# Patient Record
Sex: Male | Born: 1935 | Race: White | Hispanic: No | Smoking: Former smoker
Health system: Southern US, Community
[De-identification: ages and names within clinical notes are randomized; demographics above are authoritative.]

## PROBLEM LIST (undated history)

## (undated) DIAGNOSIS — C801 Malignant (primary) neoplasm, unspecified: Secondary | ICD-10-CM

## (undated) DIAGNOSIS — I4819 Other persistent atrial fibrillation: Secondary | ICD-10-CM

## (undated) DIAGNOSIS — I10 Essential (primary) hypertension: Secondary | ICD-10-CM

## (undated) DIAGNOSIS — R001 Bradycardia, unspecified: Secondary | ICD-10-CM

## (undated) HISTORY — PX: CHOLECYSTECTOMY: SHX55

## (undated) HISTORY — DX: Bradycardia, unspecified: R00.1

## (undated) HISTORY — DX: Other persistent atrial fibrillation: I48.19

## (undated) HISTORY — PX: OTHER SURGICAL HISTORY: SHX169

---

## 1999-02-12 ENCOUNTER — Emergency Department (HOSPITAL_COMMUNITY): Admission: EM | Admit: 1999-02-12 | Discharge: 1999-02-12 | Payer: Self-pay | Admitting: Emergency Medicine

## 1999-10-27 ENCOUNTER — Encounter: Admission: RE | Admit: 1999-10-27 | Discharge: 1999-10-27 | Payer: Self-pay | Admitting: Urology

## 1999-10-27 ENCOUNTER — Encounter: Payer: Self-pay | Admitting: Urology

## 2000-05-10 ENCOUNTER — Encounter: Payer: Self-pay | Admitting: Urology

## 2000-05-10 ENCOUNTER — Encounter: Admission: RE | Admit: 2000-05-10 | Discharge: 2000-05-10 | Payer: Self-pay | Admitting: Urology

## 2001-05-02 ENCOUNTER — Encounter: Admission: RE | Admit: 2001-05-02 | Discharge: 2001-05-02 | Payer: Self-pay | Admitting: Urology

## 2001-05-02 ENCOUNTER — Encounter: Payer: Self-pay | Admitting: Urology

## 2002-10-23 ENCOUNTER — Encounter: Payer: Self-pay | Admitting: Urology

## 2002-10-23 ENCOUNTER — Encounter: Admission: RE | Admit: 2002-10-23 | Discharge: 2002-10-23 | Payer: Self-pay | Admitting: Urology

## 2008-05-13 ENCOUNTER — Ambulatory Visit (HOSPITAL_COMMUNITY): Admission: RE | Admit: 2008-05-13 | Discharge: 2008-05-13 | Payer: Self-pay | Admitting: Urology

## 2008-05-13 ENCOUNTER — Encounter (INDEPENDENT_AMBULATORY_CARE_PROVIDER_SITE_OTHER): Payer: Self-pay | Admitting: Interventional Radiology

## 2008-05-22 ENCOUNTER — Ambulatory Visit: Payer: Self-pay | Admitting: Oncology

## 2008-06-03 LAB — CBC WITH DIFFERENTIAL/PLATELET
BASO%: 0.4 % (ref 0.0–2.0)
EOS%: 4.6 % (ref 0.0–7.0)
HCT: 40.7 % (ref 38.7–49.9)
LYMPH%: 27.3 % (ref 14.0–48.0)
MCH: 32.7 pg (ref 28.0–33.4)
MCHC: 34.8 g/dL (ref 32.0–35.9)
MCV: 94 fL (ref 81.6–98.0)
MONO%: 11.5 % (ref 0.0–13.0)
NEUT%: 56.2 % (ref 40.0–75.0)
Platelets: 205 10*3/uL (ref 145–400)

## 2008-06-03 LAB — COMPREHENSIVE METABOLIC PANEL
ALT: 24 U/L (ref 0–53)
AST: 21 U/L (ref 0–37)
Alkaline Phosphatase: 51 U/L (ref 39–117)
CO2: 29 mEq/L (ref 19–32)
Creatinine, Ser: 1.52 mg/dL — ABNORMAL HIGH (ref 0.40–1.50)
Total Bilirubin: 0.4 mg/dL (ref 0.3–1.2)

## 2008-06-03 LAB — PSA: PSA: 1.62 ng/mL (ref 0.10–4.00)

## 2008-07-02 ENCOUNTER — Ambulatory Visit: Payer: Self-pay | Admitting: Oncology

## 2008-07-07 ENCOUNTER — Ambulatory Visit (HOSPITAL_COMMUNITY): Admission: RE | Admit: 2008-07-07 | Discharge: 2008-07-07 | Payer: Self-pay | Admitting: Oncology

## 2008-07-09 LAB — CBC WITH DIFFERENTIAL/PLATELET
Basophils Absolute: 0 10*3/uL (ref 0.0–0.1)
Eosinophils Absolute: 0.3 10*3/uL (ref 0.0–0.5)
HCT: 40.3 % (ref 38.7–49.9)
LYMPH%: 28 % (ref 14.0–48.0)
MONO#: 0.7 10*3/uL (ref 0.1–0.9)
NEUT#: 2.4 10*3/uL (ref 1.5–6.5)
NEUT%: 51.3 % (ref 40.0–75.0)
Platelets: 159 10*3/uL (ref 145–400)
WBC: 4.8 10*3/uL (ref 4.0–10.0)

## 2008-07-09 LAB — COMPREHENSIVE METABOLIC PANEL
CO2: 26 mEq/L (ref 19–32)
Creatinine, Ser: 1.4 mg/dL (ref 0.40–1.50)
Glucose, Bld: 125 mg/dL — ABNORMAL HIGH (ref 70–99)
Total Bilirubin: 0.4 mg/dL (ref 0.3–1.2)
Total Protein: 6.3 g/dL (ref 6.0–8.3)

## 2008-10-09 ENCOUNTER — Ambulatory Visit: Payer: Self-pay | Admitting: Oncology

## 2008-10-13 LAB — CBC WITH DIFFERENTIAL/PLATELET
Basophils Absolute: 0 10*3/uL (ref 0.0–0.1)
EOS%: 6.9 % (ref 0.0–7.0)
Eosinophils Absolute: 0.3 10*3/uL (ref 0.0–0.5)
LYMPH%: 27.2 % (ref 14.0–48.0)
MCH: 33 pg (ref 28.0–33.4)
MCV: 94.4 fL (ref 81.6–98.0)
MONO%: 13.1 % — ABNORMAL HIGH (ref 0.0–13.0)
NEUT#: 2.3 10*3/uL (ref 1.5–6.5)
Platelets: 157 10*3/uL (ref 145–400)
RBC: 4.49 10*6/uL (ref 4.20–5.71)

## 2008-10-13 LAB — COMPREHENSIVE METABOLIC PANEL
CO2: 30 mEq/L (ref 19–32)
Creatinine, Ser: 1.29 mg/dL (ref 0.40–1.50)
Glucose, Bld: 158 mg/dL — ABNORMAL HIGH (ref 70–99)
Total Bilirubin: 0.5 mg/dL (ref 0.3–1.2)

## 2008-10-13 LAB — LACTATE DEHYDROGENASE: LDH: 160 U/L (ref 94–250)

## 2009-01-08 ENCOUNTER — Ambulatory Visit: Payer: Self-pay | Admitting: Oncology

## 2009-01-12 ENCOUNTER — Ambulatory Visit (HOSPITAL_COMMUNITY): Admission: RE | Admit: 2009-01-12 | Discharge: 2009-01-12 | Payer: Self-pay | Admitting: Oncology

## 2009-04-16 ENCOUNTER — Ambulatory Visit: Payer: Self-pay | Admitting: Oncology

## 2009-07-16 ENCOUNTER — Ambulatory Visit: Payer: Self-pay | Admitting: Oncology

## 2009-07-20 ENCOUNTER — Ambulatory Visit (HOSPITAL_COMMUNITY): Admission: RE | Admit: 2009-07-20 | Discharge: 2009-07-20 | Payer: Self-pay | Admitting: Oncology

## 2009-07-20 LAB — CBC WITH DIFFERENTIAL/PLATELET
Basophils Absolute: 0 10*3/uL (ref 0.0–0.1)
EOS%: 2.8 % (ref 0.0–7.0)
HCT: 43.5 % (ref 38.4–49.9)
HGB: 14.9 g/dL (ref 13.0–17.1)
MCH: 32.6 pg (ref 27.2–33.4)
MCV: 95.1 fL (ref 79.3–98.0)
NEUT%: 54.6 % (ref 39.0–75.0)
lymph#: 1.4 10*3/uL (ref 0.9–3.3)

## 2009-07-20 LAB — COMPREHENSIVE METABOLIC PANEL
AST: 24 U/L (ref 0–37)
BUN: 20 mg/dL (ref 6–23)
Calcium: 9.8 mg/dL (ref 8.4–10.5)
Chloride: 98 mEq/L (ref 96–112)
Creatinine, Ser: 1.62 mg/dL — ABNORMAL HIGH (ref 0.40–1.50)

## 2009-07-20 LAB — LACTATE DEHYDROGENASE: LDH: 173 U/L (ref 94–250)

## 2009-08-29 ENCOUNTER — Encounter (HOSPITAL_COMMUNITY): Payer: Self-pay | Admitting: Emergency Medicine

## 2009-08-30 ENCOUNTER — Inpatient Hospital Stay (HOSPITAL_COMMUNITY): Admission: EM | Admit: 2009-08-30 | Discharge: 2009-09-03 | Payer: Self-pay | Admitting: Internal Medicine

## 2009-09-02 ENCOUNTER — Encounter (INDEPENDENT_AMBULATORY_CARE_PROVIDER_SITE_OTHER): Payer: Self-pay | Admitting: Internal Medicine

## 2009-09-24 ENCOUNTER — Encounter: Admission: RE | Admit: 2009-09-24 | Discharge: 2009-09-24 | Payer: Self-pay | Admitting: Neurosurgery

## 2009-10-25 ENCOUNTER — Ambulatory Visit: Payer: Self-pay | Admitting: Oncology

## 2009-10-27 LAB — CBC WITH DIFFERENTIAL/PLATELET
BASO%: 0.4 % (ref 0.0–2.0)
Eosinophils Absolute: 0.2 10*3/uL (ref 0.0–0.5)
MCHC: 33.4 g/dL (ref 32.0–36.0)
MONO#: 0.6 10*3/uL (ref 0.1–0.9)
MONO%: 13 % (ref 0.0–14.0)
NEUT#: 2.7 10*3/uL (ref 1.5–6.5)
RBC: 4.01 10*6/uL — ABNORMAL LOW (ref 4.20–5.82)
RDW: 14.8 % — ABNORMAL HIGH (ref 11.0–14.6)
WBC: 4.9 10*3/uL (ref 4.0–10.3)

## 2009-10-27 LAB — COMPREHENSIVE METABOLIC PANEL
ALT: 23 U/L (ref 0–53)
AST: 20 U/L (ref 0–37)
Alkaline Phosphatase: 88 U/L (ref 39–117)
Creatinine, Ser: 1.48 mg/dL (ref 0.40–1.50)
Total Bilirubin: 0.3 mg/dL (ref 0.3–1.2)

## 2009-10-27 LAB — LACTATE DEHYDROGENASE: LDH: 158 U/L (ref 94–250)

## 2010-01-21 ENCOUNTER — Ambulatory Visit: Payer: Self-pay | Admitting: Oncology

## 2010-01-25 ENCOUNTER — Ambulatory Visit (HOSPITAL_COMMUNITY): Admission: RE | Admit: 2010-01-25 | Discharge: 2010-01-25 | Payer: Self-pay | Admitting: Oncology

## 2010-01-25 LAB — CBC WITH DIFFERENTIAL/PLATELET
BASO%: 0.3 % (ref 0.0–2.0)
Eosinophils Absolute: 0.2 10*3/uL (ref 0.0–0.5)
LYMPH%: 22 % (ref 14.0–49.0)
MCHC: 34 g/dL (ref 32.0–36.0)
MONO#: 0.7 10*3/uL (ref 0.1–0.9)
MONO%: 12.4 % (ref 0.0–14.0)
RDW: 14.1 % (ref 11.0–14.6)
WBC: 5.3 10*3/uL (ref 4.0–10.3)

## 2010-01-25 LAB — COMPREHENSIVE METABOLIC PANEL
ALT: 28 U/L (ref 0–53)
AST: 23 U/L (ref 0–37)
Albumin: 3.8 g/dL (ref 3.5–5.2)
Alkaline Phosphatase: 71 U/L (ref 39–117)
BUN: 18 mg/dL (ref 6–23)
Total Bilirubin: 0.5 mg/dL (ref 0.3–1.2)

## 2010-04-25 ENCOUNTER — Ambulatory Visit: Payer: Self-pay | Admitting: Oncology

## 2010-06-13 ENCOUNTER — Ambulatory Visit: Payer: Self-pay | Admitting: Oncology

## 2010-06-15 LAB — CBC WITH DIFFERENTIAL/PLATELET
BASO%: 0.5 % (ref 0.0–2.0)
Basophils Absolute: 0 10*3/uL (ref 0.0–0.1)
EOS%: 3.7 % (ref 0.0–7.0)
Eosinophils Absolute: 0.1 10*3/uL (ref 0.0–0.5)
HCT: 44.2 % (ref 38.4–49.9)
LYMPH%: 26.4 % (ref 14.0–49.0)
MCH: 33.5 pg — ABNORMAL HIGH (ref 27.2–33.4)
MCHC: 34.4 g/dL (ref 32.0–36.0)
MONO#: 0.4 10*3/uL (ref 0.1–0.9)
MONO%: 10.7 % (ref 0.0–14.0)
NEUT%: 58.7 % (ref 39.0–75.0)
RDW: 13.5 % (ref 11.0–14.6)

## 2010-06-15 LAB — COMPREHENSIVE METABOLIC PANEL
ALT: 25 U/L (ref 0–53)
AST: 25 U/L (ref 0–37)
CO2: 22 mEq/L (ref 19–32)
Calcium: 9.8 mg/dL (ref 8.4–10.5)
Chloride: 103 mEq/L (ref 96–112)
Sodium: 136 mEq/L (ref 135–145)
Total Bilirubin: 0.5 mg/dL (ref 0.3–1.2)

## 2010-06-15 LAB — LACTATE DEHYDROGENASE: LDH: 189 U/L (ref 94–250)

## 2010-06-29 ENCOUNTER — Ambulatory Visit (HOSPITAL_COMMUNITY): Admission: RE | Admit: 2010-06-29 | Discharge: 2010-06-29 | Payer: Self-pay | Admitting: Orthopaedic Surgery

## 2010-06-29 ENCOUNTER — Encounter (INDEPENDENT_AMBULATORY_CARE_PROVIDER_SITE_OTHER): Payer: Self-pay | Admitting: Orthopaedic Surgery

## 2010-06-29 ENCOUNTER — Ambulatory Visit: Payer: Self-pay | Admitting: Vascular Surgery

## 2010-07-18 ENCOUNTER — Encounter: Admission: RE | Admit: 2010-07-18 | Discharge: 2010-07-18 | Payer: Self-pay | Admitting: Orthopaedic Surgery

## 2010-08-03 ENCOUNTER — Emergency Department (HOSPITAL_COMMUNITY): Admission: EM | Admit: 2010-08-03 | Discharge: 2010-08-03 | Payer: Self-pay | Admitting: Family Medicine

## 2010-09-09 ENCOUNTER — Ambulatory Visit: Payer: Self-pay | Admitting: Oncology

## 2010-09-13 ENCOUNTER — Ambulatory Visit (HOSPITAL_COMMUNITY): Admission: RE | Admit: 2010-09-13 | Discharge: 2010-09-13 | Payer: Self-pay | Admitting: Oncology

## 2010-09-13 LAB — COMPREHENSIVE METABOLIC PANEL
ALT: 29 U/L (ref 0–53)
AST: 21 U/L (ref 0–37)
BUN: 18 mg/dL (ref 6–23)
Chloride: 102 mEq/L (ref 96–112)
Potassium: 4.4 mEq/L (ref 3.5–5.3)

## 2010-09-13 LAB — CBC WITH DIFFERENTIAL/PLATELET
HCT: 46 % (ref 38.4–49.9)
MCHC: 32.4 g/dL (ref 32.0–36.0)
MCV: 100.5 fL — ABNORMAL HIGH (ref 79.3–98.0)
MONO#: 0.5 10*3/uL (ref 0.1–0.9)
MONO%: 11.4 % (ref 0.0–14.0)
NEUT%: 58 % (ref 39.0–75.0)
RDW: 14 % (ref 11.0–14.6)
WBC: 4.4 10*3/uL (ref 4.0–10.3)

## 2010-12-13 ENCOUNTER — Ambulatory Visit: Payer: Self-pay | Admitting: Oncology

## 2010-12-15 LAB — COMPREHENSIVE METABOLIC PANEL
AST: 15 U/L (ref 0–37)
Albumin: 3.8 g/dL (ref 3.5–5.2)
Alkaline Phosphatase: 79 U/L (ref 39–117)
BUN: 22 mg/dL (ref 6–23)
CO2: 30 mEq/L (ref 19–32)
Calcium: 9.7 mg/dL (ref 8.4–10.5)

## 2010-12-15 LAB — CBC WITH DIFFERENTIAL/PLATELET
EOS%: 9.9 % — ABNORMAL HIGH (ref 0.0–7.0)
Eosinophils Absolute: 0.5 10*3/uL (ref 0.0–0.5)
HGB: 15 g/dL (ref 13.0–17.1)
MCH: 34.1 pg — ABNORMAL HIGH (ref 27.2–33.4)
MCV: 99.5 fL — ABNORMAL HIGH (ref 79.3–98.0)
MONO%: 11 % (ref 0.0–14.0)
NEUT%: 50.8 % (ref 39.0–75.0)
RBC: 4.39 10*6/uL (ref 4.20–5.82)
RDW: 14.3 % (ref 11.0–14.6)

## 2010-12-15 LAB — LACTATE DEHYDROGENASE: LDH: 132 U/L (ref 94–250)

## 2011-01-06 ENCOUNTER — Other Ambulatory Visit: Payer: Self-pay | Admitting: Oncology

## 2011-01-06 DIAGNOSIS — Z85528 Personal history of other malignant neoplasm of kidney: Secondary | ICD-10-CM

## 2011-01-08 ENCOUNTER — Encounter: Payer: Self-pay | Admitting: Oncology

## 2011-01-30 ENCOUNTER — Ambulatory Visit: Payer: Medicare Other | Attending: Family Medicine | Admitting: Physical Therapy

## 2011-01-30 DIAGNOSIS — M545 Low back pain, unspecified: Secondary | ICD-10-CM | POA: Insufficient documentation

## 2011-01-30 DIAGNOSIS — M6281 Muscle weakness (generalized): Secondary | ICD-10-CM | POA: Insufficient documentation

## 2011-01-30 DIAGNOSIS — R293 Abnormal posture: Secondary | ICD-10-CM | POA: Insufficient documentation

## 2011-01-30 DIAGNOSIS — R262 Difficulty in walking, not elsewhere classified: Secondary | ICD-10-CM | POA: Insufficient documentation

## 2011-01-30 DIAGNOSIS — IMO0001 Reserved for inherently not codable concepts without codable children: Secondary | ICD-10-CM | POA: Insufficient documentation

## 2011-02-06 ENCOUNTER — Ambulatory Visit: Payer: Medicare Other | Admitting: Physical Therapy

## 2011-02-09 ENCOUNTER — Ambulatory Visit: Payer: Medicare Other | Admitting: Physical Therapy

## 2011-02-13 ENCOUNTER — Ambulatory Visit: Payer: Medicare Other | Admitting: Rehabilitation

## 2011-02-16 ENCOUNTER — Ambulatory Visit: Payer: Medicare Other | Attending: Physical Medicine and Rehabilitation | Admitting: Rehabilitation

## 2011-02-16 DIAGNOSIS — R293 Abnormal posture: Secondary | ICD-10-CM | POA: Insufficient documentation

## 2011-02-16 DIAGNOSIS — IMO0001 Reserved for inherently not codable concepts without codable children: Secondary | ICD-10-CM | POA: Insufficient documentation

## 2011-02-16 DIAGNOSIS — M545 Low back pain, unspecified: Secondary | ICD-10-CM | POA: Insufficient documentation

## 2011-02-16 DIAGNOSIS — R262 Difficulty in walking, not elsewhere classified: Secondary | ICD-10-CM | POA: Insufficient documentation

## 2011-02-16 DIAGNOSIS — M6281 Muscle weakness (generalized): Secondary | ICD-10-CM | POA: Insufficient documentation

## 2011-02-20 ENCOUNTER — Ambulatory Visit: Payer: Medicare Other

## 2011-02-23 ENCOUNTER — Ambulatory Visit: Payer: Medicare Other | Admitting: Rehabilitation

## 2011-03-14 ENCOUNTER — Ambulatory Visit (HOSPITAL_COMMUNITY)
Admission: RE | Admit: 2011-03-14 | Discharge: 2011-03-14 | Disposition: A | Discharge status: Home / Self Care | Payer: Medicare Other | Source: Ambulatory Visit | Attending: Oncology | Admitting: Oncology

## 2011-03-14 ENCOUNTER — Encounter (HOSPITAL_BASED_OUTPATIENT_CLINIC_OR_DEPARTMENT_OTHER): Payer: Medicare Other | Admitting: Oncology

## 2011-03-14 ENCOUNTER — Encounter (HOSPITAL_COMMUNITY): Payer: Self-pay

## 2011-03-14 ENCOUNTER — Other Ambulatory Visit: Payer: Self-pay | Admitting: Oncology

## 2011-03-14 DIAGNOSIS — C772 Secondary and unspecified malignant neoplasm of intra-abdominal lymph nodes: Secondary | ICD-10-CM | POA: Insufficient documentation

## 2011-03-14 DIAGNOSIS — C61 Malignant neoplasm of prostate: Secondary | ICD-10-CM

## 2011-03-14 DIAGNOSIS — N400 Enlarged prostate without lower urinary tract symptoms: Secondary | ICD-10-CM | POA: Insufficient documentation

## 2011-03-14 DIAGNOSIS — R911 Solitary pulmonary nodule: Secondary | ICD-10-CM | POA: Insufficient documentation

## 2011-03-14 DIAGNOSIS — Z85528 Personal history of other malignant neoplasm of kidney: Secondary | ICD-10-CM

## 2011-03-14 DIAGNOSIS — E041 Nontoxic single thyroid nodule: Secondary | ICD-10-CM | POA: Insufficient documentation

## 2011-03-14 DIAGNOSIS — I517 Cardiomegaly: Secondary | ICD-10-CM | POA: Insufficient documentation

## 2011-03-14 DIAGNOSIS — I2584 Coronary atherosclerosis due to calcified coronary lesion: Secondary | ICD-10-CM | POA: Insufficient documentation

## 2011-03-14 DIAGNOSIS — K5730 Diverticulosis of large intestine without perforation or abscess without bleeding: Secondary | ICD-10-CM | POA: Insufficient documentation

## 2011-03-14 DIAGNOSIS — J438 Other emphysema: Secondary | ICD-10-CM | POA: Insufficient documentation

## 2011-03-14 HISTORY — DX: Essential (primary) hypertension: I10

## 2011-03-14 HISTORY — DX: Malignant (primary) neoplasm, unspecified: C80.1

## 2011-03-14 LAB — COMPREHENSIVE METABOLIC PANEL
ALT: 15 U/L (ref 0–53)
AST: 16 U/L (ref 0–37)
CO2: 29 mEq/L (ref 19–32)
Calcium: 9.3 mg/dL (ref 8.4–10.5)
Chloride: 102 mEq/L (ref 96–112)
Creatinine, Ser: 1.19 mg/dL (ref 0.40–1.50)
Potassium: 4.5 mEq/L (ref 3.5–5.3)
Sodium: 139 mEq/L (ref 135–145)
Total Protein: 6.6 g/dL (ref 6.0–8.3)

## 2011-03-14 LAB — CBC WITH DIFFERENTIAL/PLATELET
BASO%: 0.7 % (ref 0.0–2.0)
EOS%: 7.1 % — ABNORMAL HIGH (ref 0.0–7.0)
MONO#: 0.5 10*3/uL (ref 0.1–0.9)
MONO%: 12.5 % (ref 0.0–14.0)
NEUT%: 49.2 % (ref 39.0–75.0)
RBC: 4.33 10*6/uL (ref 4.20–5.82)

## 2011-03-16 ENCOUNTER — Encounter (HOSPITAL_BASED_OUTPATIENT_CLINIC_OR_DEPARTMENT_OTHER): Payer: Medicare Other | Admitting: Oncology

## 2011-03-16 DIAGNOSIS — N189 Chronic kidney disease, unspecified: Secondary | ICD-10-CM

## 2011-03-16 DIAGNOSIS — C649 Malignant neoplasm of unspecified kidney, except renal pelvis: Secondary | ICD-10-CM

## 2011-03-23 ENCOUNTER — Ambulatory Visit: Payer: Medicare Other | Attending: Physical Medicine and Rehabilitation | Admitting: Rehabilitation

## 2011-03-23 DIAGNOSIS — R262 Difficulty in walking, not elsewhere classified: Secondary | ICD-10-CM | POA: Insufficient documentation

## 2011-03-23 DIAGNOSIS — R293 Abnormal posture: Secondary | ICD-10-CM | POA: Insufficient documentation

## 2011-03-23 DIAGNOSIS — M545 Low back pain, unspecified: Secondary | ICD-10-CM | POA: Insufficient documentation

## 2011-03-23 DIAGNOSIS — M6281 Muscle weakness (generalized): Secondary | ICD-10-CM | POA: Insufficient documentation

## 2011-03-23 DIAGNOSIS — IMO0001 Reserved for inherently not codable concepts without codable children: Secondary | ICD-10-CM | POA: Insufficient documentation

## 2011-03-24 LAB — HEPATIC FUNCTION PANEL
ALT: 30 U/L (ref 0–53)
AST: 26 U/L (ref 0–37)
Albumin: 3.6 g/dL (ref 3.5–5.2)
Bilirubin, Direct: 0.2 mg/dL (ref 0.0–0.3)
Total Bilirubin: 0.8 mg/dL (ref 0.3–1.2)

## 2011-03-24 LAB — CARDIAC PANEL(CRET KIN+CKTOT+MB+TROPI)
CK, MB: 2.1 ng/mL (ref 0.3–4.0)
CK, MB: 3.5 ng/mL (ref 0.3–4.0)
CK, MB: 5.2 ng/mL — ABNORMAL HIGH (ref 0.3–4.0)
Relative Index: 2.8 — ABNORMAL HIGH (ref 0.0–2.5)
Relative Index: 4.1 — ABNORMAL HIGH (ref 0.0–2.5)
Relative Index: INVALID (ref 0.0–2.5)
Total CK: 124 U/L (ref 7–232)
Total CK: 128 U/L (ref 7–232)
Total CK: 92 U/L (ref 7–232)
Troponin I: 0.24 ng/mL — ABNORMAL HIGH (ref 0.00–0.06)
Troponin I: 0.25 ng/mL — ABNORMAL HIGH (ref 0.00–0.06)
Troponin I: 0.27 ng/mL — ABNORMAL HIGH (ref 0.00–0.06)

## 2011-03-24 LAB — BASIC METABOLIC PANEL
BUN: 21 mg/dL (ref 6–23)
CO2: 25 mEq/L (ref 19–32)
Calcium: 8.8 mg/dL (ref 8.4–10.5)
Calcium: 8.9 mg/dL (ref 8.4–10.5)
Calcium: 9.1 mg/dL (ref 8.4–10.5)
Chloride: 99 mEq/L (ref 96–112)
Chloride: 103 mEq/L (ref 96–112)
Creatinine, Ser: 1.43 mg/dL (ref 0.4–1.5)
Creatinine, Ser: 1.36 mg/dL (ref 0.4–1.5)
GFR calc Af Amer: >60 mL/min (ref >60)
Glucose, Bld: 129 mg/dL — ABNORMAL HIGH (ref 70–99)
Sodium: 135 mEq/L (ref 135–145)
Sodium: 136 mEq/L (ref 135–145)

## 2011-03-24 LAB — GLUCOSE, CAPILLARY
Glucose-Capillary: 190 mg/dL — ABNORMAL HIGH (ref 70–99)
Glucose-Capillary: 207 mg/dL — ABNORMAL HIGH (ref 70–99)
Glucose-Capillary: 244 mg/dL — ABNORMAL HIGH (ref 70–99)
Glucose-Capillary: 112 mg/dL — ABNORMAL HIGH (ref 70–99)
Glucose-Capillary: 113 mg/dL — ABNORMAL HIGH (ref 70–99)
Glucose-Capillary: 119 mg/dL — ABNORMAL HIGH (ref 70–99)
Glucose-Capillary: 125 mg/dL — ABNORMAL HIGH (ref 70–99)
Glucose-Capillary: 131 mg/dL — ABNORMAL HIGH (ref 70–99)
Glucose-Capillary: 136 mg/dL — ABNORMAL HIGH (ref 70–99)
Glucose-Capillary: 140 mg/dL — ABNORMAL HIGH (ref 70–99)
Glucose-Capillary: 162 mg/dL — ABNORMAL HIGH (ref 70–99)
Glucose-Capillary: 168 mg/dL — ABNORMAL HIGH (ref 70–99)
Glucose-Capillary: 170 mg/dL — ABNORMAL HIGH (ref 70–99)
Glucose-Capillary: 237 mg/dL — ABNORMAL HIGH (ref 70–99)
Glucose-Capillary: 251 mg/dL — ABNORMAL HIGH (ref 70–99)

## 2011-03-24 LAB — TYPE AND SCREEN
ABO/RH(D): A NEG
Antibody Screen: NEGATIVE
Antibody Screen: NEGATIVE

## 2011-03-24 LAB — LIPID PANEL
Cholesterol: 194 mg/dL (ref 0–200)
HDL: 41 mg/dL (ref >39)
Triglycerides: 128 mg/dL (ref <150)

## 2011-03-24 LAB — PROTIME-INR
INR: 1.3 (ref 0.00–1.49)
Prothrombin Time: 14.3 seconds (ref 11.6–15.2)

## 2011-03-24 LAB — PREPARE FRESH FROZEN PLASMA

## 2011-03-24 LAB — CBC
Hemoglobin: 15.3 g/dL (ref 13.0–17.0)
MCHC: 34.3 g/dL (ref 30.0–36.0)
MCV: 96.1 fL (ref 78.0–100.0)
RBC: 4.24 MIL/uL (ref 4.22–5.81)
RDW: 13.9 % (ref 11.5–15.5)
WBC: 5.1 10*3/uL (ref 4.0–10.5)

## 2011-03-24 LAB — DIFFERENTIAL
Lymphs Abs: 1 10*3/uL (ref 0.7–4.0)
Monocytes Relative: 9 % (ref 3–12)
Neutro Abs: 3.5 10*3/uL (ref 1.7–7.7)
Neutrophils Relative %: 69 % (ref 43–77)

## 2011-03-24 LAB — APTT: aPTT: 25 seconds (ref 24–37)

## 2011-03-24 LAB — PHOSPHORUS: Phosphorus: 2.9 mg/dL (ref 2.3–4.6)

## 2011-03-24 LAB — HEMOGLOBIN A1C: Mean Plasma Glucose: 194 mg/dL

## 2011-03-24 LAB — TSH: TSH: 1.919 u[IU]/mL (ref 0.350–4.500)

## 2011-03-24 LAB — TROPONIN I: Troponin I: 0.24 ng/mL — ABNORMAL HIGH (ref 0.00–0.06)

## 2011-03-24 LAB — ABO/RH
ABO/RH(D): A NEG
ABO/RH(D): A NEG

## 2011-03-28 ENCOUNTER — Ambulatory Visit: Payer: Medicare Other | Admitting: Rehabilitation

## 2011-03-30 ENCOUNTER — Ambulatory Visit: Payer: Medicare Other | Admitting: Physical Therapy

## 2011-03-31 ENCOUNTER — Encounter: Payer: Medicare Other | Admitting: Physical Therapy

## 2011-04-04 ENCOUNTER — Ambulatory Visit: Payer: Medicare Other | Admitting: Physical Therapy

## 2011-04-06 ENCOUNTER — Ambulatory Visit: Payer: Medicare Other | Admitting: Rehabilitation

## 2011-04-10 ENCOUNTER — Ambulatory Visit: Payer: Medicare Other | Admitting: Physical Therapy

## 2011-04-14 ENCOUNTER — Ambulatory Visit: Payer: Medicare Other | Admitting: Rehabilitation

## 2011-04-19 ENCOUNTER — Ambulatory Visit: Payer: Medicare Other | Attending: Physical Medicine and Rehabilitation | Admitting: Rehabilitation

## 2011-04-19 DIAGNOSIS — M545 Low back pain, unspecified: Secondary | ICD-10-CM | POA: Insufficient documentation

## 2011-04-19 DIAGNOSIS — IMO0001 Reserved for inherently not codable concepts without codable children: Secondary | ICD-10-CM | POA: Insufficient documentation

## 2011-04-19 DIAGNOSIS — M6281 Muscle weakness (generalized): Secondary | ICD-10-CM | POA: Insufficient documentation

## 2011-04-19 DIAGNOSIS — R293 Abnormal posture: Secondary | ICD-10-CM | POA: Insufficient documentation

## 2011-04-19 DIAGNOSIS — R262 Difficulty in walking, not elsewhere classified: Secondary | ICD-10-CM | POA: Insufficient documentation

## 2011-06-02 ENCOUNTER — Telehealth: Payer: Self-pay | Admitting: Cardiology

## 2011-06-02 NOTE — Telephone Encounter (Signed)
Please call to assign his new cardiologists. He hasn't been in since 09/13/10 and I didn't see a recall in the old system. You may possibly have th file.

## 2011-06-05 NOTE — Telephone Encounter (Signed)
Left message for pt to call back to set up appointment with pt's new cardiologist.

## 2011-07-06 IMAGING — CT CT HEAD W/O CM
2 series · 16 of 30 positions shown, 20 images · non-contrast
Comparison: CT 09/02/2009 and MRI 08/31/2009.

CLINICAL DATA: Follow-up subdural hematoma.

CT HEAD WITHOUT CONTRAST
TECHNIQUE: Contiguous axial images were obtained from the base of
the skull through the vertex without contrast.

[Series 3: head bone · axial · 0.49mm/px · z∈[+13,+59]mm · 3 of 32 slices shown]
[im 3/32  bone]
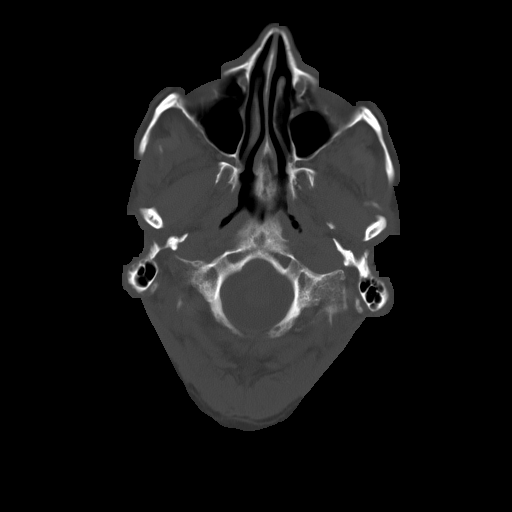
[im 7/32  bone]
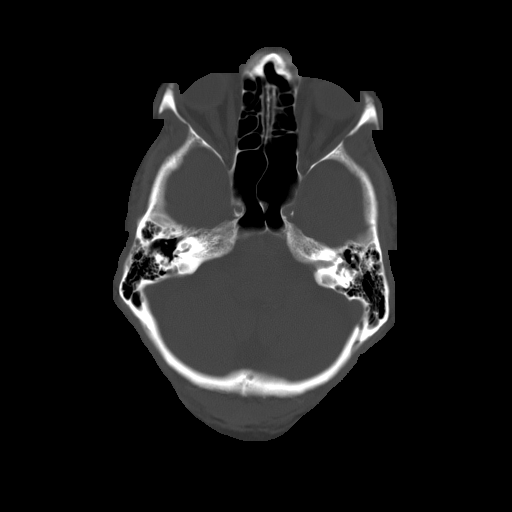
[im 12/32  bone]
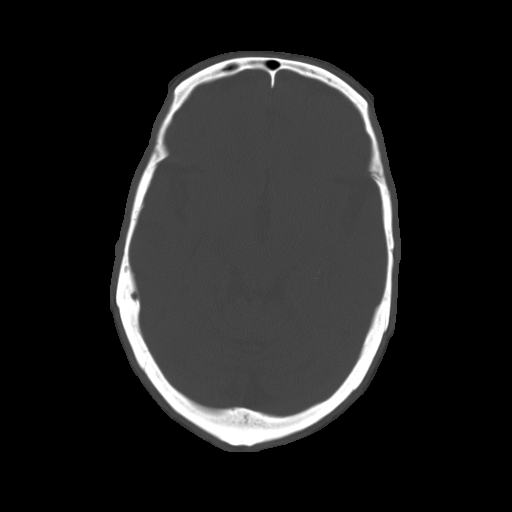

[Series 32: 3d filtered head w/o · axial · non-contrast · 0.49mm/px · z∈[+13,+148]mm · 13 of 32 slices shown, 17 images]
[im 3/32  brain]
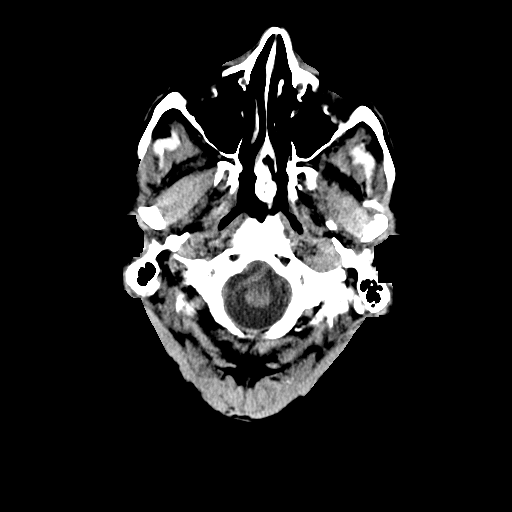
[im 3/32  bone]
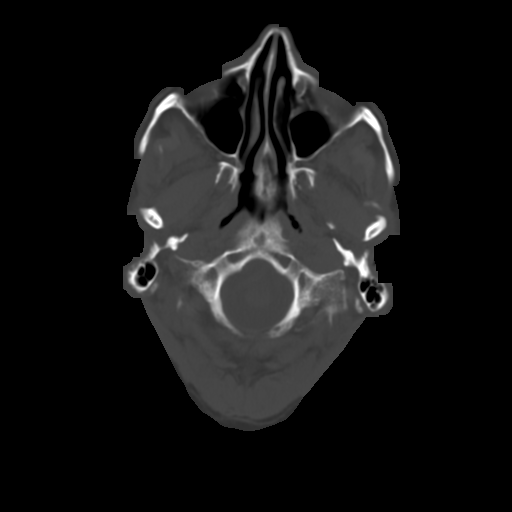
[im 5/32  brain]
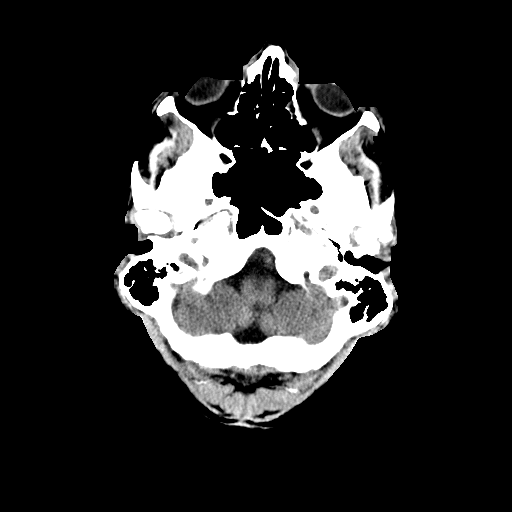
[im 7/32  brain]
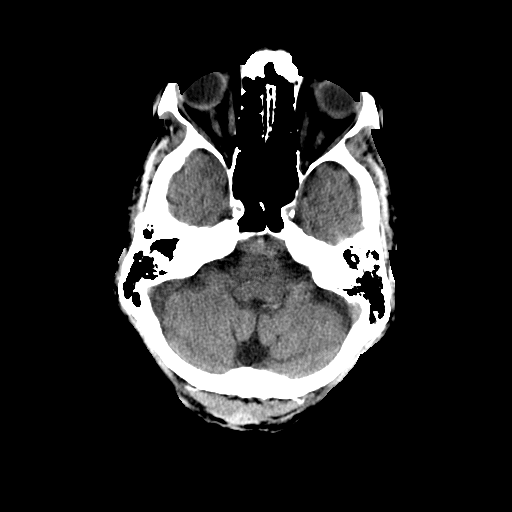
[im 9/32  brain]
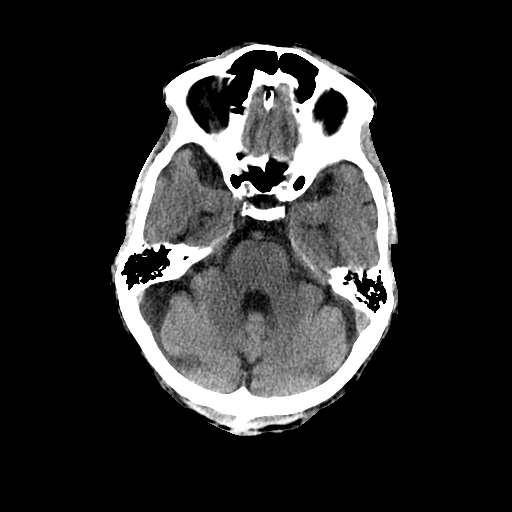
[im 12/32  brain]
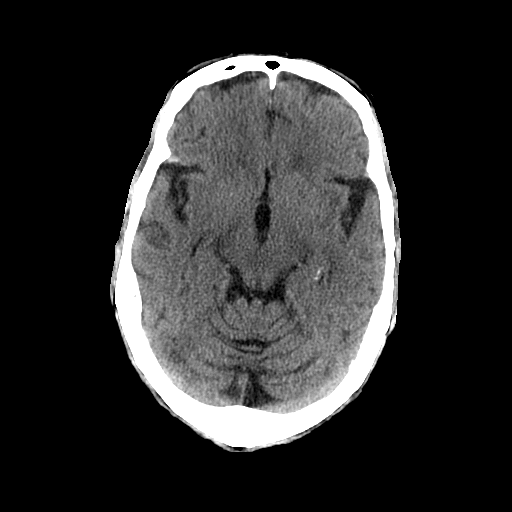
[im 12/32  bone]
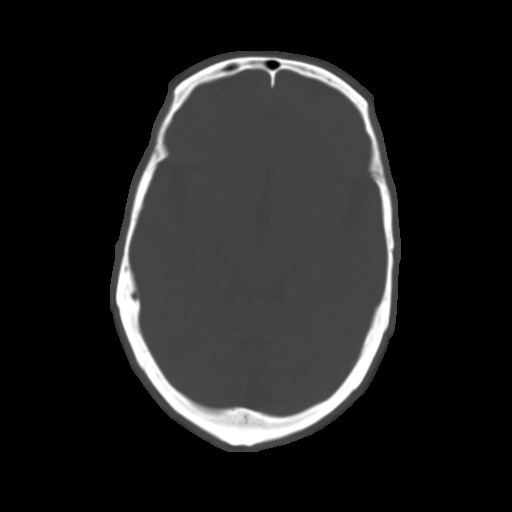
[im 14/32  brain]
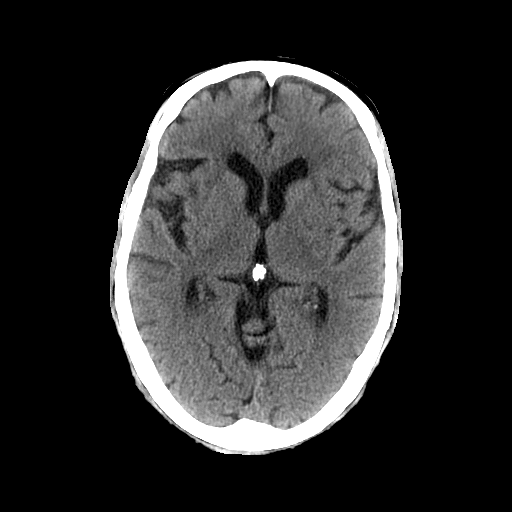
[im 16/32  brain]
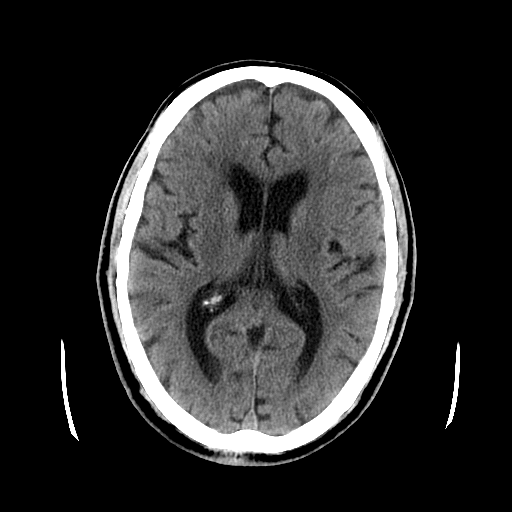
[im 18/32  brain]
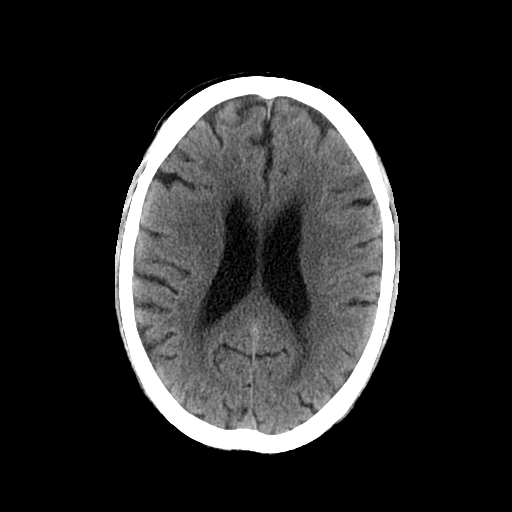
[im 20/32  brain]
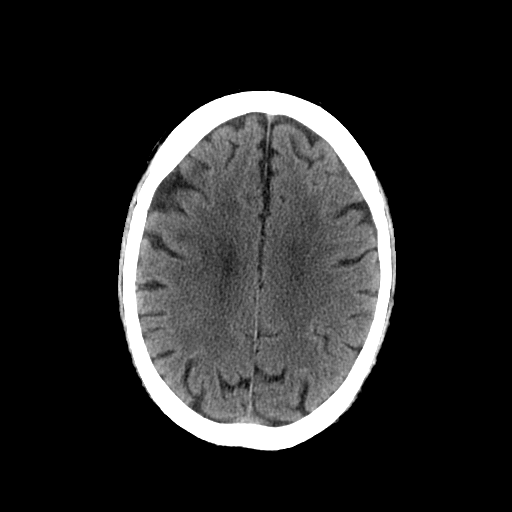
[im 20/32  bone]
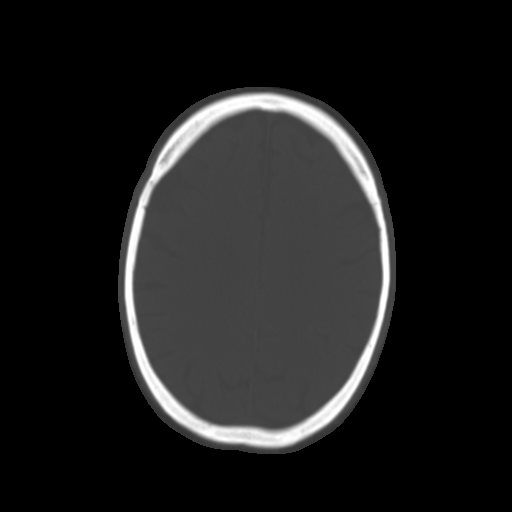
[im 23/32  brain]
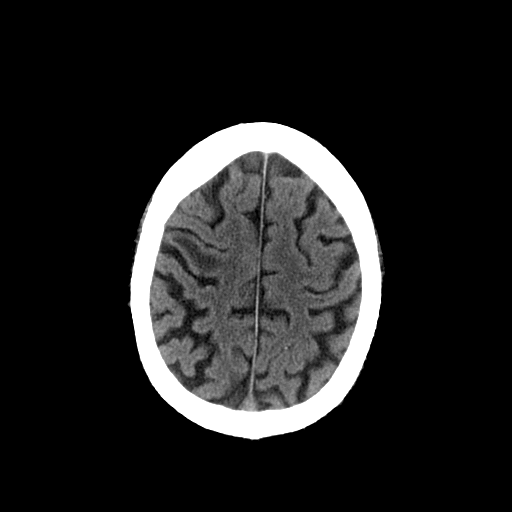
[im 25/32  brain]
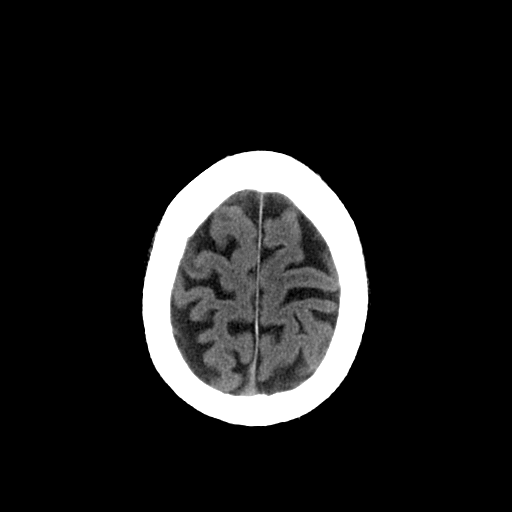
[im 27/32  brain]
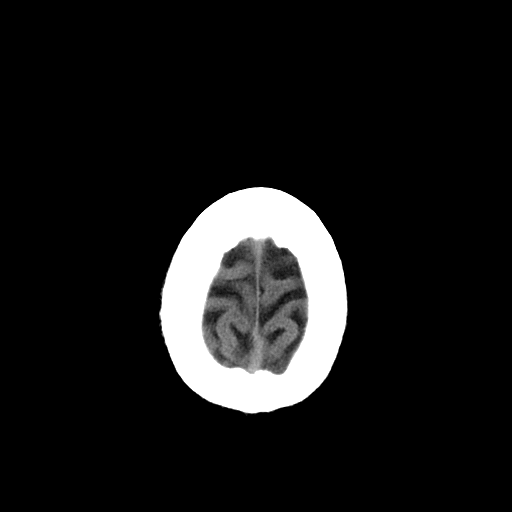
[im 29/32  brain]
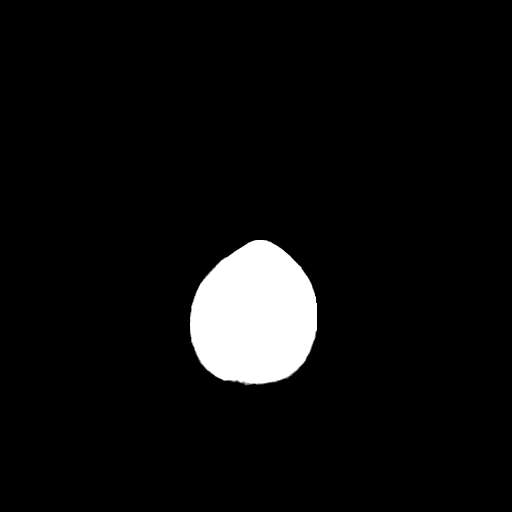
[im 29/32  bone]
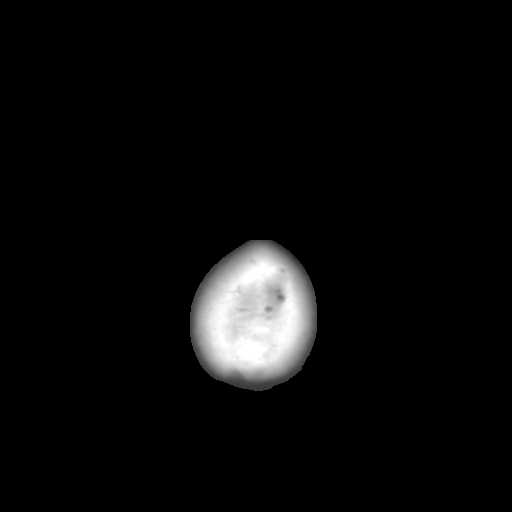

[16 of 30 positions shown; findings below may reference images not displayed]

FINDINGS: Resolving small left frontal subdural hematoma which is
now isodense with brain and smaller than on the prior study.  No
new area of hemorrhage is seen.  There is no midline shift.
Chronic ischemia in the frontal and parietal white matter
bilaterally.  Low density in the right frontal gyrus is unchanged
from prior study.  Note is made of hemorrhage in this area on the
prior MRI in this may be a post traumatic contusion.
IMPRESSION: Resolving left frontal subdural hematoma.  No new area of
hemorrhage.

## 2011-07-11 ENCOUNTER — Other Ambulatory Visit: Payer: Self-pay | Admitting: Oncology

## 2011-07-11 ENCOUNTER — Encounter (HOSPITAL_BASED_OUTPATIENT_CLINIC_OR_DEPARTMENT_OTHER): Payer: Medicare Other | Admitting: Oncology

## 2011-07-11 DIAGNOSIS — C649 Malignant neoplasm of unspecified kidney, except renal pelvis: Secondary | ICD-10-CM

## 2011-07-11 DIAGNOSIS — N189 Chronic kidney disease, unspecified: Secondary | ICD-10-CM

## 2011-07-11 LAB — CBC WITH DIFFERENTIAL/PLATELET
Eosinophils Absolute: 0.3 10*3/uL (ref 0.0–0.5)
MONO#: 0.5 10*3/uL (ref 0.1–0.9)
NEUT#: 3 10*3/uL (ref 1.5–6.5)
Platelets: 161 10*3/uL (ref 140–400)
RBC: 4.24 10*6/uL (ref 4.20–5.82)
RDW: 14.2 % (ref 11.0–14.6)
WBC: 5.1 10*3/uL (ref 4.0–10.3)
lymph#: 1.3 10*3/uL (ref 0.9–3.3)

## 2011-07-11 LAB — COMPREHENSIVE METABOLIC PANEL
ALT: 27 U/L (ref 0–53)
Albumin: 3.9 g/dL (ref 3.5–5.2)
CO2: 27 mEq/L (ref 19–32)
Chloride: 106 mEq/L (ref 96–112)
Glucose, Bld: 143 mg/dL — ABNORMAL HIGH (ref 70–99)
Potassium: 4.2 mEq/L (ref 3.5–5.3)
Sodium: 140 mEq/L (ref 135–145)
Total Protein: 6.5 g/dL (ref 6.0–8.3)

## 2011-08-11 ENCOUNTER — Other Ambulatory Visit: Payer: Self-pay | Admitting: Neurology

## 2011-08-11 DIAGNOSIS — I6350 Cerebral infarction due to unspecified occlusion or stenosis of unspecified cerebral artery: Secondary | ICD-10-CM

## 2011-08-14 ENCOUNTER — Encounter: Payer: Self-pay | Admitting: Cardiovascular Disease

## 2011-08-15 ENCOUNTER — Ambulatory Visit (INDEPENDENT_AMBULATORY_CARE_PROVIDER_SITE_OTHER): Payer: Medicare Other | Admitting: Cardiovascular Disease

## 2011-08-15 ENCOUNTER — Encounter: Payer: Self-pay | Admitting: Cardiovascular Disease

## 2011-08-15 DIAGNOSIS — E782 Mixed hyperlipidemia: Secondary | ICD-10-CM | POA: Insufficient documentation

## 2011-08-15 DIAGNOSIS — R001 Bradycardia, unspecified: Secondary | ICD-10-CM | POA: Insufficient documentation

## 2011-08-15 DIAGNOSIS — Z5181 Encounter for therapeutic drug level monitoring: Secondary | ICD-10-CM

## 2011-08-15 DIAGNOSIS — I10 Essential (primary) hypertension: Secondary | ICD-10-CM | POA: Insufficient documentation

## 2011-08-15 DIAGNOSIS — I498 Other specified cardiac arrhythmias: Secondary | ICD-10-CM

## 2011-08-15 DIAGNOSIS — I4891 Unspecified atrial fibrillation: Secondary | ICD-10-CM

## 2011-08-15 DIAGNOSIS — Z7901 Long term (current) use of anticoagulants: Secondary | ICD-10-CM | POA: Insufficient documentation

## 2011-08-15 NOTE — Patient Instructions (Signed)
Your physician recommends that you schedule a follow-up appointment in: AS NEEDED  Your physician recommends that you continue on your current medications as directed. Please refer to the Current Medication list given to you today.  

## 2011-08-15 NOTE — Assessment & Plan Note (Signed)
STable avoid AV nodal blocking drugs.  No indication for pacer

## 2011-08-15 NOTE — Progress Notes (Signed)
77 yo previously seen by Dr Deborah Chalk.  Chronic afib.  Not on coumadin due to multiple falls and subdurals.  Previous bradycardia but no syncope or need for pacer.  No history of CAD or CHF.  Activity limited by age, arthritis, previous subdural and tremor in UE;s.  No TIA off coumadin.  NO palpitations.  Compliant with meds.  Stays with son during week and back home over weekend.  No longer driving.  Frederick Tucker is primary.    ROS: Denies fever, malais, weight loss, blurry vision, decreased visual acuity, cough, sputum, SOB, hemoptysis, pleuritic pain, palpitaitons, heartburn, abdominal pain, melena, lower extremity edema, claudication, or rash.  All other systems reviewed and negative  General: Affect appropriate Frail and elderly HEENT: normal Neck supple with no adenopathy JVP normal no bruits no thyromegaly Lungs clear with no wheezing and good diaphragmatic motion Heart:  S1/S2 no murmur,rub, gallop or click PMI normal Abdomen: benighn, BS positve, no tenderness, no AAA no bruit.  No HSM or HJR S/P nephrectomy and choly Distal pulses intact with no bruits No edema Neuro non-focal Skin warm and dry No muscular weakness   Current Outpatient Prescriptions  Medication Sig Dispense Refill  . clopidogrel (PLAVIX) 75 MG tablet Take 75 mg by mouth daily.        Marland Kitchen glimepiride (AMARYL) 4 MG tablet Take 4 mg by mouth 2 (two) times daily.       Marland Kitchen lisinopril (PRINIVIL,ZESTRIL) 10 MG tablet Take 10 mg by mouth daily.        . metFORMIN (GLUCOPHAGE) 500 MG tablet Take 500 mg by mouth 2 (two) times daily with a meal.        . PARoxetine (PAXIL) 20 MG tablet Take 20 mg by mouth every morning.        . propranolol (INDERAL) 20 MG tablet Take 20 mg by mouth 2 (two) times daily.        . simvastatin (ZOCOR) 20 MG tablet Take 20 mg by mouth daily.        Marland Kitchen topiramate (TOPAMAX) 50 MG tablet Take 50 mg by mouth as directed.        . cimetidine (TAGAMET) 200 MG tablet Take 200 mg by mouth 4 (four) times  daily.          Allergies  Review of patient's allergies indicates not on file.  Electrocardiogram:  Afib rate 63  LAD poor R wave progression  Assessment and Plan

## 2011-08-15 NOTE — Assessment & Plan Note (Signed)
Well controlled.  Continue current medications and low sodium Dash type diet.    

## 2011-08-15 NOTE — Assessment & Plan Note (Signed)
Agree he is not a candidate for coumadin anymore

## 2011-08-15 NOTE — Assessment & Plan Note (Signed)
Cholesterol is at goal.  Continue current dose of statin and diet Rx.  No myalgias or side effects.  F/U  LFT's in 6 months. Lab Results  Component Value Date   LDLCALC  Value: 127        Total Cholesterol/HDL:CHD Risk Coronary Heart Disease Risk Table                     Men   Women  1/2 Average Risk   3.4   3.3  Average Risk       5.0   4.4  2 X Average Risk   9.6   7.1  3 X Average Risk  23.4   11.0        Use the calculated Patient Ratio above and the CHD Risk Table to determine the patient's CHD Risk.        ATP III CLASSIFICATION (LDL):  <100     mg/dL   Optimal  161-096  mg/dL   Near or Above                    Optimal  130-159  mg/dL   Borderline  045-409  mg/dL   High  >811     mg/dL   Very High* 08/31/7828

## 2011-08-15 NOTE — Assessment & Plan Note (Signed)
Good rate control Asymptomatic

## 2011-08-17 ENCOUNTER — Ambulatory Visit
Admission: RE | Admit: 2011-08-17 | Discharge: 2011-08-17 | Disposition: A | Discharge status: Home / Self Care | Payer: Medicare Other | Source: Ambulatory Visit | Attending: Neurology | Admitting: Neurology

## 2011-08-17 ENCOUNTER — Other Ambulatory Visit: Payer: Self-pay | Admitting: Neurology

## 2011-08-17 DIAGNOSIS — I6350 Cerebral infarction due to unspecified occlusion or stenosis of unspecified cerebral artery: Secondary | ICD-10-CM

## 2011-08-17 MED ORDER — GADOBENATE DIMEGLUMINE 529 MG/ML IV SOLN
15.0000 mL | Freq: Once | INTRAVENOUS | Status: AC | PRN
  Administered 2011-08-17: 15 mL via INTRAVENOUS

## 2011-09-13 LAB — CBC
MCHC: 35.1
MCV: 94.8
RBC: 4.3
RDW: 13.3

## 2011-10-25 ENCOUNTER — Other Ambulatory Visit: Payer: Self-pay | Admitting: Family Medicine

## 2011-10-25 DIAGNOSIS — I639 Cerebral infarction, unspecified: Secondary | ICD-10-CM

## 2011-11-01 ENCOUNTER — Ambulatory Visit
Admission: RE | Admit: 2011-11-01 | Discharge: 2011-11-01 | Disposition: A | Discharge status: Home / Self Care | Payer: Medicare Other | Source: Ambulatory Visit | Attending: Family Medicine | Admitting: Family Medicine

## 2011-11-01 ENCOUNTER — Encounter: Payer: Self-pay | Admitting: *Deleted

## 2011-11-01 DIAGNOSIS — I639 Cerebral infarction, unspecified: Secondary | ICD-10-CM

## 2011-11-14 ENCOUNTER — Other Ambulatory Visit: Payer: Self-pay | Admitting: Oncology

## 2011-11-14 ENCOUNTER — Inpatient Hospital Stay (HOSPITAL_COMMUNITY)
Admission: RE | Admit: 2011-11-14 | Discharge: 2011-11-14 | Payer: Medicare Other | Source: Ambulatory Visit | Attending: Oncology | Admitting: Oncology

## 2011-11-14 ENCOUNTER — Other Ambulatory Visit (HOSPITAL_COMMUNITY): Payer: Medicare Other

## 2011-11-14 ENCOUNTER — Ambulatory Visit (HOSPITAL_COMMUNITY)
Admission: RE | Admit: 2011-11-14 | Discharge: 2011-11-14 | Disposition: A | Discharge status: Home / Self Care | Payer: Medicare Other | Source: Ambulatory Visit | Attending: Oncology | Admitting: Oncology

## 2011-11-14 ENCOUNTER — Other Ambulatory Visit (HOSPITAL_BASED_OUTPATIENT_CLINIC_OR_DEPARTMENT_OTHER): Payer: Medicare Other | Admitting: Lab

## 2011-11-14 DIAGNOSIS — K862 Cyst of pancreas: Secondary | ICD-10-CM | POA: Insufficient documentation

## 2011-11-14 DIAGNOSIS — N189 Chronic kidney disease, unspecified: Secondary | ICD-10-CM

## 2011-11-14 DIAGNOSIS — C649 Malignant neoplasm of unspecified kidney, except renal pelvis: Secondary | ICD-10-CM | POA: Insufficient documentation

## 2011-11-14 DIAGNOSIS — I889 Nonspecific lymphadenitis, unspecified: Secondary | ICD-10-CM | POA: Insufficient documentation

## 2011-11-14 DIAGNOSIS — Z9089 Acquired absence of other organs: Secondary | ICD-10-CM | POA: Insufficient documentation

## 2011-11-14 DIAGNOSIS — N400 Enlarged prostate without lower urinary tract symptoms: Secondary | ICD-10-CM | POA: Insufficient documentation

## 2011-11-14 DIAGNOSIS — Q619 Cystic kidney disease, unspecified: Secondary | ICD-10-CM | POA: Insufficient documentation

## 2011-11-14 LAB — CBC WITH DIFFERENTIAL/PLATELET
BASO%: 0.3 % (ref 0.0–2.0)
LYMPH%: 23.5 % (ref 14.0–49.0)
MCHC: 32.8 g/dL (ref 32.0–36.0)
MONO#: 0.6 10*3/uL (ref 0.1–0.9)
MONO%: 11.1 % (ref 0.0–14.0)
Platelets: 165 10*3/uL (ref 140–400)
RBC: 4.47 10*6/uL (ref 4.20–5.82)
RDW: 15.1 % — ABNORMAL HIGH (ref 11.0–14.6)
WBC: 5.6 10*3/uL (ref 4.0–10.3)

## 2011-11-14 LAB — CMP (CANCER CENTER ONLY)
ALT(SGPT): 27 U/L (ref 10–47)
Alkaline Phosphatase: 109 U/L — ABNORMAL HIGH (ref 26–84)
CO2: 30 mEq/L (ref 18–33)
Sodium: 143 mEq/L (ref 128–145)
Total Bilirubin: 0.6 mg/dl (ref 0.20–1.60)
Total Protein: 7.4 g/dL (ref 6.4–8.1)

## 2011-11-16 ENCOUNTER — Other Ambulatory Visit: Payer: Medicare Other | Admitting: Lab

## 2011-11-16 ENCOUNTER — Ambulatory Visit (HOSPITAL_BASED_OUTPATIENT_CLINIC_OR_DEPARTMENT_OTHER): Payer: Medicare Other | Admitting: Oncology

## 2011-11-16 VITALS — BP 141/77 | HR 63 | Temp 96.9°F | Ht 71.0 in | Wt 164.8 lb

## 2011-11-16 DIAGNOSIS — C649 Malignant neoplasm of unspecified kidney, except renal pelvis: Secondary | ICD-10-CM

## 2011-11-16 DIAGNOSIS — Z8673 Personal history of transient ischemic attack (TIA), and cerebral infarction without residual deficits: Secondary | ICD-10-CM

## 2011-11-16 DIAGNOSIS — N189 Chronic kidney disease, unspecified: Secondary | ICD-10-CM

## 2011-11-16 DIAGNOSIS — C801 Malignant (primary) neoplasm, unspecified: Secondary | ICD-10-CM

## 2011-11-16 DIAGNOSIS — R599 Enlarged lymph nodes, unspecified: Secondary | ICD-10-CM

## 2011-11-16 NOTE — Progress Notes (Signed)
Hematology and Oncology Follow Up Visit  Frederick Tucker [id number] Feb 17, 1924 77 y.o. 11/16/2011 10:23 AM    CC: Frederick Tucker. Frederick Tucker, M.D. Frederick Tucker, M.D.    Principle Diagnosis: An 77 year old gentleman with renal cell carcinoma diagnosed in 1998, metastatic disease, and confirmed in 2009.   Prior Therapy: 1. Status post left nephrectomy in 1998.  He had a T2 N0 clear cell histology. 2. The patient developed periaortic lymphadenopathy, biopsy-proven, in May 2009.  He is continued on active surveillance and due to indolent nature of his disease, he has not been on anticancer treatment.  SECONDARY DIAGNOSES:  History of CVA as well as other comorbid conditions.  Current therapy: Observation and surveillance.   Interim History:   Frederick Tucker presents today for a followup visit.  He has continued to progress quite nicely from his overall health status.  He is not reporting any recent hospitalization, is not reporting any recent illnesses, is not reporting any deterioration in his health.  His mobility is improving slightly.  He is not reporting any abdominal pain.  He has not reported any constitutional symptoms or change in his performance status. He did not report any flank pain or bleeding from his GU tract. No pain reported at any level.   Medications: I have reviewed the patient's current medications. Current outpatient prescriptions:cimetidine (TAGAMET) 200 MG tablet, Take 200 mg by mouth 4 (four) times daily.  , Disp: , Rfl: ;  clopidogrel (PLAVIX) 75 MG tablet, Take 75 mg by mouth daily.  , Disp: , Rfl: ;  glimepiride (AMARYL) 4 MG tablet, Take 4 mg by mouth 2 (two) times daily. , Disp: , Rfl: ;  lisinopril (PRINIVIL,ZESTRIL) 10 MG tablet, Take 10 mg by mouth daily.  , Disp: , Rfl:  metFORMIN (GLUCOPHAGE) 500 MG tablet, Take 500 mg by mouth 2 (two) times daily with a meal.  , Disp: , Rfl: ;  PARoxetine (PAXIL) 20 MG tablet, Take 20 mg by mouth every morning.  , Disp: , Rfl:  ;  propranolol (INDERAL) 20 MG tablet, Take 20 mg by mouth 2 (two) times daily.  , Disp: , Rfl: ;  simvastatin (ZOCOR) 20 MG tablet, Take 20 mg by mouth daily.  , Disp: , Rfl:  topiramate (TOPAMAX) 50 MG tablet, Take 50 mg by mouth as directed.  , Disp: , Rfl: ;  triamterene-hydrochlorothiazide (MAXZIDE-25) 37.5-25 MG per tablet, Take 1 tablet by mouth daily.  , Disp: , Rfl: ;  warfarin (COUMADIN) 5 MG tablet, Take 5 mg by mouth daily.  , Disp: , Rfl:   Allergies: No Known Allergies  Past Medical History, Surgical history, Social history, and Family History were reviewed and updated.  Review of Systems: Constitutional:  Negative for fever, chills, night sweats, anorexia, weight loss, pain. Cardiovascular: no chest pain or dyspnea on exertion Respiratory: no cough, shortness of breath, or wheezing Neurological: no TIA or stroke symptoms Dermatological: negative ENT: negative Skin: Negative. Gastrointestinal: no abdominal pain, change in bowel habits, or black or bloody stools Genito-Urinary: no dysuria, trouble voiding, or hematuria Hematological and Lymphatic: negative Breast: negative Musculoskeletal: negative Remaining ROS negative. Physical Exam: Blood pressure 141/77, pulse 63, temperature 96.9 F (36.1 C), height 5\' 11"  (1.803 m), weight 164 lb 12.8 oz (74.753 kg). ECOG: 2 General appearance: alert Head: Normocephalic, without obvious abnormality, atraumatic Neck: no adenopathy, no carotid bruit, no JVD, supple, symmetrical, trachea midline and thyroid not enlarged, symmetric, no tenderness/mass/nodules Lymph nodes: Cervical, supraclavicular, and axillary nodes normal.  Heart:regular rate and rhythm, S1, S2 normal, no murmur, click, rub or gallop Lung:chest clear, no wheezing, rales, normal symmetric air entry Abdomin: soft, non-tender, without masses or organomegaly EXT:no erythema, induration, or nodules   Lab Results: Lab Results  Component Value Date   WBC 5.6 11/14/2011    HGB 14.6 11/14/2011   HCT 44.6 11/14/2011   MCV 99.7* 11/14/2011   PLT 165 11/14/2011     Chemistry      Component Value Date/Time   NA 143 11/14/2011 1051   NA 140 07/11/2011 1010   K 4.6 11/14/2011 1051   K 4.2 07/11/2011 1010   CL 107 11/14/2011 1051   CL 106 07/11/2011 1010   CO2 30 11/14/2011 1051   CO2 27 07/11/2011 1010   BUN 17 11/14/2011 1051   BUN 25* 07/11/2011 1010   CREATININE 1.3* 11/14/2011 1051   CREATININE 1.30 07/11/2011 1010      Component Value Date/Time   CALCIUM 9.3 11/14/2011 1051   CALCIUM 9.5 07/11/2011 1010   ALKPHOS 109* 11/14/2011 1051   ALKPHOS 103 07/11/2011 1010   AST 29 11/14/2011 1051   AST 21 07/11/2011 1010   ALT 27 07/11/2011 1010   BILITOT 0.60 11/14/2011 1051   BILITOT 0.5 07/11/2011 1010       Radiological Studies: Ct Abdomen Pelvis Wo Contrast  11/14/2011  *RADIOLOGY REPORT*  Clinical Data:  Renal cell carcinoma diagnosed 1998.  Recurrence 2009.  CT CHEST, ABDOMEN AND PELVIS WITHOUT CONTRAST  Technique:  Multidetector CT imaging of the chest, abdomen and pelvis was performed following the standard protocol without IV contrast.  Comparison:  CT 03/14/2011  CT CHEST  Findings:  No axillary or supraclavicular lymphadenopathy.  No mediastinal or hilar lymphadenopathy.  No pericardial fluid. Esophagus is normal.  No new or suspicious pulmonary nodules.  IMPRESSION: No evidence of thoracic metastasis.  CT ABDOMEN AND PELVIS  Findings:  Non-IV contrast images demonstrate no focal hepatic lesion.  Gallbladder is surgically absent.  The pancreas, spleen, and adrenal glands are unchanged.  2 cm cyst associated with the tail the pancreas is unchanged.   Surgical clips in the  left nephrectomy bed.  Left periaortic mass measures 3.9 x 3.9 cm compared to the 6.0 x 5.6 cm on prior.  A calcified node anterior the aorta is unchanged measuring 3.3 cm in greatest dimension.  No new nodularity in the nephrectomy bed.  No immediate density cyst extending from the  right kidney (image 75) is unchanged in size measuring 13 mm.  Nonobstructing stone within the right kidney.  The stomach, small bowel, and colon are normal.  Abdominal aorta is normal caliber with intimal calcification.  No free fluid the pelvis.  Prostate gland is enlarged similar to prior.  No pelvic lymphadenopathy.  Bladder appears normal. Review of  bone windows demonstrates no aggressive osseous lesions.  IMPRESSION:  1.  Left periaortic mass is decreased in size compared to prior. 2.  Stable calcified retroperitoneal lymph node. 3.  No new nodularity within the left nephrectomy bed. 4.  Stable intermediate density cyst extending from the right kidney.  5.  Stable cystic lesion in the tail of pancreas.  Original Report Authenticated By: Genevive Bi, M.D.      Impression and Plan:  This is a pleasant 77 year old gentleman with the following issues. 1. Renal cell carcinoma.  He has stage IV disease.  He has periaortic lymphadenopathy and been monitored for the last 3 years.  Again, his disease had been rather  minimal, however on recent may be starting to get bulkier in nature.  His most recent scan discussed today with the patient and his son, and showed actual improvement in his tumor. Unfortunately, Frederick Tucker is not the greatest of shape to withstand any specific anticancer treatments in the last few months given his history of cerebrovascular accident and poor health. There are no indication for treatment at this time and will plan continue observation and surveillance.  2. Chronic renal insufficiency is currently stable. 3. Cerebrovascular accident and overall performance status seems to be improving slowly. 4. Follow up in 4 months and repeat imaging studies in 8-12 months.    [facility], MD 11/29/201210:23 AM

## 2012-03-14 ENCOUNTER — Ambulatory Visit (HOSPITAL_BASED_OUTPATIENT_CLINIC_OR_DEPARTMENT_OTHER): Payer: Medicare Other | Admitting: Oncology

## 2012-03-14 ENCOUNTER — Telehealth: Payer: Self-pay | Admitting: Oncology

## 2012-03-14 ENCOUNTER — Other Ambulatory Visit (HOSPITAL_BASED_OUTPATIENT_CLINIC_OR_DEPARTMENT_OTHER): Payer: Medicare Other | Admitting: Lab

## 2012-03-14 VITALS — BP 155/85 | HR 59 | Temp 96.7°F | Ht 71.0 in | Wt 170.8 lb

## 2012-03-14 DIAGNOSIS — C801 Malignant (primary) neoplasm, unspecified: Secondary | ICD-10-CM

## 2012-03-14 DIAGNOSIS — C649 Malignant neoplasm of unspecified kidney, except renal pelvis: Secondary | ICD-10-CM

## 2012-03-14 DIAGNOSIS — N289 Disorder of kidney and ureter, unspecified: Secondary | ICD-10-CM

## 2012-03-14 LAB — COMPREHENSIVE METABOLIC PANEL
ALT: 9 U/L (ref 0–53)
Alkaline Phosphatase: 71 U/L (ref 39–117)
Sodium: 142 mEq/L (ref 135–145)
Total Bilirubin: 0.5 mg/dL (ref 0.3–1.2)
Total Protein: 6.9 g/dL (ref 6.0–8.3)

## 2012-03-14 LAB — CBC WITH DIFFERENTIAL/PLATELET
BASO%: 0.4 % (ref 0.0–2.0)
LYMPH%: 29.8 % (ref 14.0–49.0)
MCHC: 33.4 g/dL (ref 32.0–36.0)
MCV: 98.1 fL — ABNORMAL HIGH (ref 79.3–98.0)
MONO%: 10.6 % (ref 0.0–14.0)
Platelets: 171 10*3/uL (ref 140–400)
RBC: 4.59 10*6/uL (ref 4.20–5.82)

## 2012-03-14 NOTE — Telephone Encounter (Signed)
Gv pt appt for july2013. scheduled pt for ct scan on 07/29 @ [facility]  °

## 2012-03-14 NOTE — Progress Notes (Signed)
Hematology and Oncology Follow Up Visit  Frederick Tucker [id number] 12-Sep-1924 78 y.o. 03/14/2012 10:45 AM    CC: [name], M.D. [name], M.D.    Principle Diagnosis: An 78 year old gentleman with renal cell carcinoma diagnosed in 1998, metastatic disease, and confirmed in 2009.   Prior Therapy: 1. Status post left nephrectomy in 1998.  He had a T2 N0 clear cell histology. 2. The patient developed periaortic lymphadenopathy, biopsy-proven, in May 2009.  He is continued on active surveillance and due to indolent nature of his disease, he has not been on anticancer treatment.  SECONDARY DIAGNOSES:  History of CVA as well as other comorbid conditions.  Current therapy: Observation and surveillance.   Interim History:   Frederick Tucker presents today for a followup visit.  He has continued to progress quite nicely from his overall health status.  He is not reporting any recent hospitalization, is not reporting any recent illnesses, is not reporting any deterioration in his health.  His mobility is improving slightly.  He is not reporting any abdominal pain.  He has not reported any constitutional symptoms or change in his performance status.He did not report any flank pain or bleeding from his GU tract. No pain reported at any level. He is eating better with about 10 lb weight gain. His ambulation is much improved.    Medications: I have reviewed the patient's current medications. Current outpatient prescriptions:cimetidine (TAGAMET) 200 MG tablet, Take 200 mg by mouth 4 (four) times daily.  , Disp: , Rfl: ;  clopidogrel (PLAVIX) 75 MG tablet, Take 75 mg by mouth daily.  , Disp: , Rfl: ;  glimepiride (AMARYL) 4 MG tablet, Take 4 mg by mouth 2 (two) times daily. , Disp: , Rfl: ;  lisinopril (PRINIVIL,ZESTRIL) 10 MG tablet, Take 10 mg by mouth daily.  , Disp: , Rfl:  metFORMIN (GLUCOPHAGE) 500 MG tablet, Take 500 mg by mouth 2 (two) times daily with a meal.  , Disp: , Rfl: ;   PARoxetine (PAXIL) 20 MG tablet, Take 20 mg by mouth every morning.  , Disp: , Rfl: ;  propranolol (INDERAL) 20 MG tablet, Take 20 mg by mouth 2 (two) times daily.  , Disp: , Rfl: ;  simvastatin (ZOCOR) 20 MG tablet, Take 20 mg by mouth daily.  , Disp: , Rfl:  topiramate (TOPAMAX) 50 MG tablet, Take 50 mg by mouth as directed.  , Disp: , Rfl: ;  triamterene-hydrochlorothiazide (MAXZIDE-25) 37.5-25 MG per tablet, Take 1 tablet by mouth daily.  , Disp: , Rfl: ;  warfarin (COUMADIN) 5 MG tablet, Take 5 mg by mouth daily.  , Disp: , Rfl:   Allergies: No Known Allergies  Past Medical History, Surgical history, Social history, and Family History were reviewed and updated.  Review of Systems: Constitutional:  Negative for fever, chills, night sweats, anorexia, weight loss, pain. Cardiovascular: no chest pain or dyspnea on exertion Respiratory: no cough, shortness of breath, or wheezing Neurological: no TIA or stroke symptoms Dermatological: negative ENT: negative Skin: Negative. Gastrointestinal: no abdominal pain, change in bowel habits, or black or bloody stools Genito-Urinary: no dysuria, trouble voiding, or hematuria Hematological and Lymphatic: negative Breast: negative Musculoskeletal: negative Remaining ROS negative. Physical Exam: Blood pressure 155/85, pulse 59, temperature 96.7 F (35.9 C), temperature source Oral, height 5\' 11"  (1.803 m), weight 170 lb 12.8 oz (77.474 kg). ECOG: 2 General appearance: alert Head: Normocephalic, without obvious abnormality, atraumatic Neck: no adenopathy, no carotid bruit, no JVD, supple,  symmetrical, trachea midline and thyroid not enlarged, symmetric, no tenderness/mass/nodules Lymph nodes: Cervical, supraclavicular, and axillary nodes normal. Heart:regular rate and rhythm, S1, S2 normal, no murmur, click, rub or gallop Lung:chest clear, no wheezing, rales, normal symmetric air entry Abdomin: soft, non-tender, without masses or organomegaly EXT:no  erythema, induration, or nodules   Lab Results: Lab Results  Component Value Date   WBC 4.3 03/14/2012   HGB 15.1 03/14/2012   HCT 45.1 03/14/2012   MCV 98.1* 03/14/2012   PLT 171 03/14/2012     Chemistry      Component Value Date/Time   NA 143 11/14/2011 1051   NA 140 07/11/2011 1010   K 4.6 11/14/2011 1051   K 4.2 07/11/2011 1010   CL 107 11/14/2011 1051   CL 106 07/11/2011 1010   CO2 30 11/14/2011 1051   CO2 27 07/11/2011 1010   BUN 17 11/14/2011 1051   BUN 25* 07/11/2011 1010   CREATININE 1.3* 11/14/2011 1051   CREATININE 1.30 07/11/2011 1010      Component Value Date/Time   CALCIUM 9.3 11/14/2011 1051   CALCIUM 9.5 07/11/2011 1010   ALKPHOS 109* 11/14/2011 1051   ALKPHOS 103 07/11/2011 1010   AST 29 11/14/2011 1051   AST 21 07/11/2011 1010   ALT 27 07/11/2011 1010   BILITOT 0.60 11/14/2011 1051   BILITOT 0.5 07/11/2011 1010         Impression and Plan:  This is a pleasant 78 year old gentleman with the following issues. 1. Renal cell carcinoma.  He has stage IV disease.  He has periaortic lymphadenopathy and been monitored for the last 3 years.  Again, his disease had been rather minimal, however on recent may be starting to get bulkier in nature. Unfortunately, Frederick Tucker is not the greatest of shape to withstand any specific anticancer treatments in the last few months given his history of cerebrovascular accident and poor health. There are no indication for treatment at this time and will plan continue observation and surveillance. I will repeat CT scan in 4 months.  2. Chronic renal insufficiency is currently stable. 3. Cerebrovascular accident and overall performance status seems to be improving slowly. 4. Follow up in 4 months and repeat imaging studies  At that time.    [facility], MD 3/28/201310:45 AM

## 2012-07-15 ENCOUNTER — Ambulatory Visit (HOSPITAL_COMMUNITY)
Admission: RE | Admit: 2012-07-15 | Discharge: 2012-07-15 | Disposition: A | Discharge status: Home / Self Care | Payer: Medicare Other | Source: Ambulatory Visit | Attending: Oncology | Admitting: Oncology

## 2012-07-15 ENCOUNTER — Other Ambulatory Visit (HOSPITAL_BASED_OUTPATIENT_CLINIC_OR_DEPARTMENT_OTHER): Payer: Medicare Other

## 2012-07-15 DIAGNOSIS — C801 Malignant (primary) neoplasm, unspecified: Secondary | ICD-10-CM | POA: Insufficient documentation

## 2012-07-15 DIAGNOSIS — I700 Atherosclerosis of aorta: Secondary | ICD-10-CM | POA: Insufficient documentation

## 2012-07-15 DIAGNOSIS — C649 Malignant neoplasm of unspecified kidney, except renal pelvis: Secondary | ICD-10-CM

## 2012-07-15 DIAGNOSIS — R1909 Other intra-abdominal and pelvic swelling, mass and lump: Secondary | ICD-10-CM | POA: Insufficient documentation

## 2012-07-15 DIAGNOSIS — Z905 Acquired absence of kidney: Secondary | ICD-10-CM | POA: Insufficient documentation

## 2012-07-15 DIAGNOSIS — N289 Disorder of kidney and ureter, unspecified: Secondary | ICD-10-CM | POA: Insufficient documentation

## 2012-07-15 DIAGNOSIS — K862 Cyst of pancreas: Secondary | ICD-10-CM | POA: Insufficient documentation

## 2012-07-15 LAB — CMP (CANCER CENTER ONLY)
Alkaline Phosphatase: 75 U/L (ref 26–84)
BUN, Bld: 23 mg/dL — ABNORMAL HIGH (ref 7–22)
Creat: 1.3 mg/dl — ABNORMAL HIGH (ref 0.6–1.2)
Glucose, Bld: 161 mg/dL — ABNORMAL HIGH (ref 73–118)
Sodium: 142 mEq/L (ref 128–145)
Total Bilirubin: 0.6 mg/dl (ref 0.20–1.60)
Total Protein: 6.7 g/dL (ref 6.4–8.1)

## 2012-07-15 LAB — CBC WITH DIFFERENTIAL/PLATELET
Eosinophils Absolute: 0.1 10*3/uL (ref 0.0–0.5)
LYMPH%: 30.1 % (ref 14.0–49.0)
MCV: 98.2 fL — ABNORMAL HIGH (ref 79.3–98.0)
MONO%: 10.2 % (ref 0.0–14.0)
NEUT#: 3.1 10*3/uL (ref 1.5–6.5)
Platelets: 167 10*3/uL (ref 140–400)
RBC: 4.28 10*6/uL (ref 4.20–5.82)

## 2012-07-17 ENCOUNTER — Ambulatory Visit: Payer: Medicare Other | Admitting: Oncology

## 2012-10-22 ENCOUNTER — Other Ambulatory Visit: Payer: Self-pay | Admitting: Oncology

## 2012-10-22 ENCOUNTER — Telehealth: Payer: Self-pay | Admitting: *Deleted

## 2012-10-22 ENCOUNTER — Telehealth: Payer: Self-pay | Admitting: Oncology

## 2012-10-22 DIAGNOSIS — C649 Malignant neoplasm of unspecified kidney, except renal pelvis: Secondary | ICD-10-CM

## 2012-10-22 NOTE — Telephone Encounter (Signed)
Patient called asking when is his next appointment.  Instructed him to call to reschedule appointment he missed on July 31,2013.  Denies knowing about this appointment.  Confirmed home number but patient only comes here on the weekends.  Lives with his son during the week and can be reached at [phone].  Asked that he check the answering machine more regularly because the Phone tree called on Monday for the Wednesday appt he missed.  Requested we call him at the "[phone]" number.  Will ask registration to change this to the home number per patient's request.  We will make the home number to the mobile number the home number in order to reach patient.   Call transferred to [phone] for scheduling and told patient he can call back and ask for Dr. Alver Fisher scheduler.

## 2012-10-22 NOTE — Telephone Encounter (Signed)
lvm for pt regarding Nov appt....mailed Nov appt schedule to pt.

## 2012-10-24 ENCOUNTER — Telehealth: Payer: Self-pay | Admitting: Oncology

## 2012-10-24 NOTE — Telephone Encounter (Signed)
Left Message - lvm for pt regarding NOV 15th was the only early appt day.....scheduled   By Collier Salina

## 2012-10-24 NOTE — Telephone Encounter (Signed)
Mailed pt appt schedule for NOV

## 2012-11-01 ENCOUNTER — Ambulatory Visit: Payer: Medicare Other | Admitting: Oncology

## 2012-11-01 ENCOUNTER — Telehealth: Payer: Self-pay | Admitting: Oncology

## 2012-11-01 ENCOUNTER — Ambulatory Visit (HOSPITAL_BASED_OUTPATIENT_CLINIC_OR_DEPARTMENT_OTHER): Payer: Medicare Other | Admitting: Oncology

## 2012-11-01 VITALS — BP 143/66 | HR 51 | Temp 96.9°F | Resp 20 | Ht 71.0 in | Wt 166.1 lb

## 2012-11-01 DIAGNOSIS — C649 Malignant neoplasm of unspecified kidney, except renal pelvis: Secondary | ICD-10-CM

## 2012-11-01 DIAGNOSIS — N289 Disorder of kidney and ureter, unspecified: Secondary | ICD-10-CM

## 2012-11-01 NOTE — Telephone Encounter (Signed)
gv and printed pt appt schedule for march 2014...the patient aware that central schedule will call with d/t of ct.....gv pt barium.

## 2012-11-01 NOTE — Progress Notes (Signed)
Hematology and Oncology Follow Up Visit  Frederick Tucker [id number] 09-Mar-1924 78 y.o. 11/01/2012 10:45 AM    CC: [name], M.D. [name], M.D.    Principle Diagnosis: An 78 year old gentleman with renal cell carcinoma diagnosed in 1998, metastatic disease, and confirmed in 2009.   Prior Therapy: 1. Status post left nephrectomy in 1998.  He had a T2 N0 clear cell histology. 2. The patient developed periaortic lymphadenopathy, biopsy-proven, in May 2009.  He is continued on active surveillance and due to indolent nature of his disease, he has not been on anticancer treatment.  SECONDARY DIAGNOSES:  History of CVA as well as other comorbid conditions.  Current therapy: Observation and surveillance.   Interim History:   Frederick Tucker presents today for a followup visit.  He has continued to have good overall health status.  He is not reporting any recent hospitalization, is not reporting any recent illnesses, is not reporting any deterioration in his health.  His mobility is improving slightly.  He is not reporting any abdominal pain.  He has not reported any constitutional symptoms or change in his performance status.He did not report any flank pain or bleeding from his GU tract.No pain reported at any level. He is eating better. His ambulation is much improved.    Medications: I have reviewed the patient's current medications. Current outpatient prescriptions:cimetidine (TAGAMET) 200 MG tablet, Take 200 mg by mouth 4 (four) times daily.  , Disp: , Rfl: ;  clopidogrel (PLAVIX) 75 MG tablet, Take 75 mg by mouth daily.  , Disp: , Rfl: ;  glimepiride (AMARYL) 4 MG tablet, Take 4 mg by mouth 2 (two) times daily. , Disp: , Rfl: ;  lisinopril (PRINIVIL,ZESTRIL) 10 MG tablet, Take 20 mg by mouth daily. , Disp: , Rfl:  metFORMIN (GLUCOPHAGE) 500 MG tablet, Take 500 mg by mouth 2 (two) times daily with a meal.  , Disp: , Rfl: ;  PARoxetine (PAXIL) 20 MG tablet, Take 20 mg by mouth  every morning.  , Disp: , Rfl: ;  propranolol (INDERAL) 20 MG tablet, Take 20 mg by mouth 2 (two) times daily.  , Disp: , Rfl: ;  simvastatin (ZOCOR) 20 MG tablet, Take 20 mg by mouth daily.  , Disp: , Rfl:  topiramate (TOPAMAX) 50 MG tablet, Take 50 mg by mouth as directed.  , Disp: , Rfl: ;  triamterene-hydrochlorothiazide (MAXZIDE-25) 37.5-25 MG per tablet, Take 1 tablet by mouth daily.  , Disp: , Rfl: ;  warfarin (COUMADIN) 5 MG tablet, Take 5 mg by mouth daily.  , Disp: , Rfl:   Allergies: No Known Allergies  Past Medical History, Surgical history, Social history, and Family History were reviewed and updated.  Review of Systems: Constitutional:  Negative for fever, chills, night sweats, anorexia, weight loss, pain. Cardiovascular: no chest pain or dyspnea on exertion Respiratory: no cough, shortness of breath, or wheezing Neurological: no TIA or stroke symptoms Dermatological: negative ENT: negative Skin: Negative. Gastrointestinal: no abdominal pain, change in bowel habits, or black or bloody stools Genito-Urinary: no dysuria, trouble voiding, or hematuria Hematological and Lymphatic: negative Breast: negative Musculoskeletal: negative Remaining ROS negative. Physical Exam: Blood pressure 143/66, pulse 51, temperature 96.9 F (36.1 C), temperature source Oral, resp. rate 20, height 5\' 11"  (1.803 m), weight 166 lb 1.6 oz (75.342 kg). ECOG: 2 General appearance: alert Head: Normocephalic, without obvious abnormality, atraumatic Neck: no adenopathy, no carotid bruit, no JVD, supple, symmetrical, trachea midline and thyroid not enlarged, symmetric,  no tenderness/mass/nodules Lymph nodes: Cervical, supraclavicular, and axillary nodes normal. Heart:regular rate and rhythm, S1, S2 normal, no murmur, click, rub or gallop Lung:chest clear, no wheezing, rales, normal symmetric air entry Abdomin: soft, non-tender, without masses or organomegaly EXT:no erythema, induration, or  nodules   Lab Results: Lab Results  Component Value Date   WBC 5.4 07/15/2012   HGB 14.0 07/15/2012   HCT 42.0 07/15/2012   MCV 98.2* 07/15/2012   PLT 167 07/15/2012     Chemistry      Component Value Date/Time   NA 142 07/15/2012 1008   NA 142 03/14/2012 0956   K 4.8* 07/15/2012 1008   K 4.6 03/14/2012 0956   CL 100 07/15/2012 1008   CL 102 03/14/2012 0956   CO2 29 07/15/2012 1008   CO2 33* 03/14/2012 0956   BUN 23* 07/15/2012 1008   BUN 24* 03/14/2012 0956   CREATININE 1.3* 07/15/2012 1008   CREATININE 1.38* 03/14/2012 0956      Component Value Date/Time   CALCIUM 9.6 07/15/2012 1008   CALCIUM 10.5 03/14/2012 0956   ALKPHOS 75 07/15/2012 1008   ALKPHOS 71 03/14/2012 0956   AST 27 07/15/2012 1008   AST 23 03/14/2012 0956   ALT 9 03/14/2012 0956   BILITOT 0.60 07/15/2012 1008   BILITOT 0.5 03/14/2012 0956      CT CHEST, ABDOMEN AND PELVIS WITHOUT CONTRAST  Technique: Multidetector CT imaging of the chest, abdomen and  pelvis was performed following the standard protocol without IV  contrast.  Comparison: 11/14/2011  CT CHEST  Findings: Unenhanced CT was performed per clinician order. Lack of  IV contrast limits sensitivity and specificity, especially for  evaluation of abdominal/pelvic solid viscera.  Trace superior pericardial recess fluid again noted. Heart size is  normal. Moderate atherosclerotic aortic calcification and coronary  serial calcification. Small mediastinal lymph nodes are stable.  No new lymphadenopathy. Subcutaneous right paraspinal 3.9 cm  cystic lesion with possible extension to the skin surface image 41  is unchanged, likely a sebaceous cyst. No apparent invasion of  underlying soft tissue structures.  Minimal biapical pleural thickening. Tiny right upper lobe  branching tree in bud type nodular airspace opacities are new.  This is also seen, to a lesser extent, in the superior segment  right and left lower lobes. Otherwise, no new mass, consolidation,  or  nodule is identified to suggest intrathoracic pulmonary  metastatic disease.  IMPRESSION:  Patchy areas of tree in bud type nodular airspace opacity in the  right upper lobe and superior segments of the bilateral lower lobe,  which generally indicates small airways infectious etiology.  Correlate with any recent symptoms of upper respiratory infection.  No specific evidence for intrathoracic metastatic disease.  CT ABDOMEN AND PELVIS  Findings: 2.2 cm pancreatic tail cystic lesion is stable allowing  for differences in technique and slice selection. Internal  dependent hyperdensity likely indicates milk of calcium. Left  nephrectomy bed clips are again noted these produce streak  artifact. Partly calcified retroperitoneal soft tissue mass  adjacent to left nephrectomy bed again noted measuring 3.7 x 3.4 cm  (previously 3.9 x 3.9 cm). Small retroperitoneal lymph nodes more  inferiorly are stable, including representative aortocaval node  measuring 5 mm image 93.  Partly calcified retroperitoneal mass is stable measuring 3.2 x 2.5  cm image 80.  No new retroperitoneal or intra-abdominal/pelvic mass lesion to  suggest a new metastatic disease.  Mild density of the right renal papillae again noted which may  be  seen with nephrocalcinosis. Previously seen right mid renal  calculus has apparently passed distally to the level of the right  ureterovesicular junction image 124, or possibly dependently within  the bladder. No right hydroureter or hydronephrosis. Exophytic  1.3 cm right mid renal cortical mass measuring higher than expected  for simple fluid density is again noted. This is stable.  No bowel wall thickening or focal segmental dilatation. Unenhanced  liver, spleen, and adrenal glands are normal.  Right inguinal clips are noted. The bladder is decompressed but  otherwise unremarkable with the exception of a right UVJ calculus  or intravesical calculus. Lumbar spine degenerative  change noted.  No new lytic or sclerotic osseous lesion to suggest metastasis. No  spine compression deformity allowing for probable inferior endplate  L4 Schmorl's node formation.  IMPRESSION:  Decrease in size of dominant partly calcified retroperitoneal  presumed metastatic conglomeration of lymph nodes.  Stable calcified retroperitoneal presumed metastatic lymph node.  Evidence of left nephrectomy without new mass lesion within the  left nephrectomy bed.  Stable pancreatic tail cyst.  Apparent interval passage of previously seen right renal calculus  to the level of the right ureterovesicular junction or possibly  dependently within the bladder. No secondary right  hydroureteronephrosis.  Stable probable hyperdense cyst right mid kidney, likely benign  given lack of interval change when compared to prior studies dating  back to at least 07/29/2008.     Impression and Plan:  This is a pleasant 78 year old gentleman with the following issues. 1. Renal cell carcinoma.  He has stage IV disease.  He has periaortic lymphadenopathy and been monitored for the last 4 years. Last CT scan obtained in 06/2012 was reviewed and continue to show that the disease had been rather unchanged. Unfortunately, Frederick Tucker is not the greatest of shape to withstand any specific anticancer treatments in the last few months given his history of cerebrovascular accident and poor health. There are no indication for treatment at this time and will plan continue observation and surveillance. I will repeat CT scan in 4 months.  2. Chronic renal insufficiency is currently stable. 3. Cerebrovascular accident and overall performance status seems to be improving slowly. 4. Follow up in 4 months and repeat imaging studies  At that time.    [facility], MD 11/15/201310:45 AM

## 2012-11-05 ENCOUNTER — Ambulatory Visit: Payer: Medicare Other | Admitting: Oncology

## 2012-11-05 ENCOUNTER — Encounter: Payer: Self-pay | Admitting: Cardiology

## 2013-02-25 ENCOUNTER — Other Ambulatory Visit (HOSPITAL_BASED_OUTPATIENT_CLINIC_OR_DEPARTMENT_OTHER): Payer: Medicare Other

## 2013-02-25 ENCOUNTER — Ambulatory Visit (HOSPITAL_COMMUNITY)
Admission: RE | Admit: 2013-02-25 | Discharge: 2013-02-25 | Disposition: A | Discharge status: Home / Self Care | Payer: Medicare Other | Source: Ambulatory Visit | Attending: Oncology | Admitting: Oncology

## 2013-02-25 DIAGNOSIS — N289 Disorder of kidney and ureter, unspecified: Secondary | ICD-10-CM | POA: Insufficient documentation

## 2013-02-25 DIAGNOSIS — C649 Malignant neoplasm of unspecified kidney, except renal pelvis: Secondary | ICD-10-CM

## 2013-02-25 DIAGNOSIS — I700 Atherosclerosis of aorta: Secondary | ICD-10-CM | POA: Insufficient documentation

## 2013-02-25 DIAGNOSIS — L723 Sebaceous cyst: Secondary | ICD-10-CM | POA: Insufficient documentation

## 2013-02-25 DIAGNOSIS — K862 Cyst of pancreas: Secondary | ICD-10-CM | POA: Insufficient documentation

## 2013-02-25 LAB — CBC WITH DIFFERENTIAL/PLATELET
Basophils Absolute: 0 10*3/uL (ref 0.0–0.1)
Eosinophils Absolute: 0.1 10*3/uL (ref 0.0–0.5)
HCT: 46.4 % (ref 38.4–49.9)
HGB: 15.7 g/dL (ref 13.0–17.1)
LYMPH%: 28.1 % (ref 14.0–49.0)
MCV: 99 fL — ABNORMAL HIGH (ref 79.3–98.0)
MONO%: 11.7 % (ref 0.0–14.0)
NEUT#: 2.9 10*3/uL (ref 1.5–6.5)
NEUT%: 57.6 % (ref 39.0–75.0)
Platelets: 138 10*3/uL — ABNORMAL LOW (ref 140–400)

## 2013-02-25 LAB — COMPREHENSIVE METABOLIC PANEL (CC13)
Alkaline Phosphatase: 92 U/L (ref 40–150)
BUN: 19.1 mg/dL (ref 7.0–26.0)
CO2: 30 mEq/L — ABNORMAL HIGH (ref 22–29)
Glucose: 149 mg/dl — ABNORMAL HIGH (ref 70–99)
Total Bilirubin: 0.74 mg/dL (ref 0.20–1.20)

## 2013-02-27 ENCOUNTER — Telehealth: Payer: Self-pay | Admitting: Oncology

## 2013-02-27 ENCOUNTER — Ambulatory Visit (HOSPITAL_BASED_OUTPATIENT_CLINIC_OR_DEPARTMENT_OTHER): Payer: Medicare Other | Admitting: Oncology

## 2013-02-27 VITALS — BP 148/82 | HR 67 | Temp 97.0°F | Resp 18 | Wt 167.6 lb

## 2013-02-27 DIAGNOSIS — R599 Enlarged lymph nodes, unspecified: Secondary | ICD-10-CM

## 2013-02-27 DIAGNOSIS — N189 Chronic kidney disease, unspecified: Secondary | ICD-10-CM

## 2013-02-27 DIAGNOSIS — C649 Malignant neoplasm of unspecified kidney, except renal pelvis: Secondary | ICD-10-CM

## 2013-02-27 NOTE — Progress Notes (Signed)
Hematology and Oncology Follow Up Visit  Frederick Tucker [id number] 12/11/1924 79 y.o. 02/27/2013 10:26 AM    CC: Frederick Tucker, M.D. Frederick Tucker, M.D.    Principle Diagnosis: An 79 year old gentleman with renal cell carcinoma diagnosed in 1998, metastatic disease, and confirmed in 2009.   Prior Therapy: 1. Status post left nephrectomy in 1998.  He had a T2 N0 clear cell histology. 2. The patient developed periaortic lymphadenopathy, biopsy-proven, in May 2009.  He is continued on active surveillance and due to indolent nature of his disease, he has not been on anticancer treatment.  SECONDARY DIAGNOSES:  History of CVA as well as other comorbid conditions.  Current therapy: Observation and surveillance.   Interim History:   Frederick Tucker presents today for a followup visit.  He has continued to have good overall health status.  He is not reporting any recent hospitalization, is not reporting any recent illnesses, is not reporting any deterioration in his health.  His mobility is improving slightly.  He is not reporting any abdominal pain.  He has not reported any constitutional symptoms or change in his performance status.He did not report any flank pain or bleeding from his GU tract.No pain reported at any level. He is eating better. His ambulation is much improved.    Medications: I have reviewed the patient's current medications. Current outpatient prescriptions:cimetidine (TAGAMET) 200 MG tablet, Take 200 mg by mouth 4 (four) times daily.  , Disp: , Rfl: ;  clopidogrel (PLAVIX) 75 MG tablet, Take 75 mg by mouth daily.  , Disp: , Rfl: ;  glimepiride (AMARYL) 4 MG tablet, Take 4 mg by mouth 2 (two) times daily. , Disp: , Rfl: ;  lisinopril (PRINIVIL,ZESTRIL) 10 MG tablet, Take 20 mg by mouth daily. , Disp: , Rfl:  metFORMIN (GLUCOPHAGE) 500 MG tablet, Take 500 mg by mouth 2 (two) times daily with a meal.  , Disp: , Rfl: ;  PARoxetine (PAXIL) 20 MG tablet, Take 20 mg by mouth  every morning.  , Disp: , Rfl: ;  propranolol (INDERAL) 20 MG tablet, Take 20 mg by mouth 2 (two) times daily.  , Disp: , Rfl: ;  simvastatin (ZOCOR) 20 MG tablet, Take 20 mg by mouth daily.  , Disp: , Rfl:  topiramate (TOPAMAX) 50 MG tablet, Take 50 mg by mouth as directed.  , Disp: , Rfl: ;  triamterene-hydrochlorothiazide (MAXZIDE-25) 37.5-25 MG per tablet, Take 1 tablet by mouth daily.  , Disp: , Rfl: ;  warfarin (COUMADIN) 5 MG tablet, Take 5 mg by mouth daily.  , Disp: , Rfl:   Allergies: No Known Allergies  Past Medical History, Surgical history, Social history, and Family History were reviewed and updated.  Review of Systems: Constitutional:  Negative for fever, chills, night sweats, anorexia, weight loss, pain. Cardiovascular: no chest pain or dyspnea on exertion Respiratory: no cough, shortness of breath, or wheezing Neurological: no TIA or stroke symptoms Dermatological: negative ENT: negative Skin: Negative. Gastrointestinal: no abdominal pain, change in bowel habits, or black or bloody stools Genito-Urinary: no dysuria, trouble voiding, or hematuria Hematological and Lymphatic: negative Breast: negative Musculoskeletal: negative Remaining ROS negative. Physical Exam: There were no vitals taken for this visit. ECOG: 2 General appearance: alert Head: Normocephalic, without obvious abnormality, atraumatic Neck: no adenopathy, no carotid bruit, no JVD, supple, symmetrical, trachea midline and thyroid not enlarged, symmetric, no tenderness/mass/nodules Lymph nodes: Cervical, supraclavicular, and axillary nodes normal. Heart:regular rate and rhythm, S1, S2 normal, no murmur, click,  rub or gallop Lung:chest clear, no wheezing, rales, normal symmetric air entry Abdomin: soft, non-tender, without masses or organomegaly EXT:no erythema, induration, or nodules   Lab Results: Lab Results  Component Value Date   WBC 5.0 02/25/2013   HGB 15.7 02/25/2013   HCT 46.4 02/25/2013   MCV  99.0* 02/25/2013   PLT 138* 02/25/2013     Chemistry      Component Value Date/Time   NA 139 02/25/2013 0922   NA 142 07/15/2012 1008   NA 142 03/14/2012 0956   K 3.8 02/25/2013 0922   K 4.8* 07/15/2012 1008   K 4.6 03/14/2012 0956   CL 101 02/25/2013 0922   CL 100 07/15/2012 1008   CL 102 03/14/2012 0956   CO2 30* 02/25/2013 0922   CO2 29 07/15/2012 1008   CO2 33* 03/14/2012 0956   BUN 19.1 02/25/2013 0922   BUN 23* 07/15/2012 1008   BUN 24* 03/14/2012 0956   CREATININE 1.3 02/25/2013 0922   CREATININE 1.3* 07/15/2012 1008   CREATININE 1.38* 03/14/2012 0956      Component Value Date/Time   CALCIUM 10.4 02/25/2013 0922   CALCIUM 9.6 07/15/2012 1008   CALCIUM 10.5 03/14/2012 0956   ALKPHOS 92 02/25/2013 0922   ALKPHOS 75 07/15/2012 1008   ALKPHOS 71 03/14/2012 0956   AST 20 02/25/2013 0922   AST 27 07/15/2012 1008   AST 23 03/14/2012 0956   ALT 18 02/25/2013 0922   ALT 9 03/14/2012 0956   BILITOT 0.74 02/25/2013 0922   BILITOT 0.60 07/15/2012 1008   BILITOT 0.5 03/14/2012 0956     CT CHEST, ABDOMEN AND PELVIS WITHOUT CONTRAST  Technique: Multidetector CT imaging of the chest, abdomen and  pelvis was performed following the standard protocol without IV  contrast.  Comparison: Prior examination 07/15/2012 and 11/14/2011.  CT CHEST  Findings: The mediastinum has a stable appearance as evaluated in  the noncontrast state. No enlarged mediastinal or hilar lymph  nodes are identified. There is stable atherosclerosis of the  aorta, great vessels and coronary arteries.  There is no pleural or pericardial effusion. The previously  demonstrated tree in bud opacities in both lungs have resolved.  The lungs are now clear without suspicious nodularity. There are  mild emphysematous changes.  A probable sebaceous cyst in the lower right back is again noted,  measuring 3.9 x 2.6 cm on image 44. There are no worrisome osseous  findings.  IMPRESSION:  1. No evidence of thoracic metastatic disease.  2.  Interval resolution of probable inflammatory changes in both  lungs.  3. Stable atherosclerosis and probable sebaceous cyst in the lower  right back.  CT ABDOMEN AND PELVIS  Findings: There are stable postsurgical changes status post left  nephrectomy. The peripherally calcified 3.2 x 2.4 cm mass anterior  to the aorta at the L2-L3 level is unchanged. Likewise, the  partially calcified left periaortic nodal mass appears similar,  measuring approximately 3.3 x 3.2 cm on image 86 (previously 3.7 x  3.4 cm). There is no progressive adenopathy.  Both adrenal glands appear normal. The small exophytic lesion  projecting from the interpolar region of the right kidney is  stable, measuring 12 mm on image 76. Mild medullary  nephrocalcinosis on the right is again suggested.  The liver and spleen appear unremarkable. The gallbladder is  surgically absent. There is stable pancreatic atrophy and a stable  2.3 cm cystic lesion with a dependent fluid-fluid level in the  pancreatic tail on  image 71.  Aorto iliac atherosclerosis and moderate enlargement of the  prostate gland are stable. There are postsurgical changes in the  right inguinal canal. No recurrent hernia is identified. The  small bowel, colon and stomach demonstrate no abnormalities.  There are no worrisome osseous findings.  IMPRESSION:  1. Stable to minimally improved partially calcified  retroperitoneal masses on the left. No progressive adenopathy  identified.  2. No other evidence of metastatic disease.  3. Stable small hyperdense right renal lesion.  4. Stable cystic lesion within the pancreatic tail (slowly growing  compared with older prior studies, but of doubtful significance).   Impression and Plan:  This is a pleasant 79 year old gentleman with the following issues. 1. Renal cell carcinoma.  He has stage IV disease.  He has periaortic lymphadenopathy and been monitored for the last 4 years. Last CT scan obtained in 02/2013  was reviewed and continue to show that the disease had been rather unchanged. Unfortunately, Mr. Cleary is not the greatest of shape to withstand any specific anticancer treatments in the last few months given his history of cerebrovascular accident and poor health. There are no indication for treatment at this time and will plan continue observation and surveillance. I will repeat CT scan in 8 months.  2. Chronic renal insufficiency is currently stable. 3. Cerebrovascular accident and overall performance status seems to be improving slowly. 4. Follow up in 4 months and repeat imaging studies in 8 months.     [facility], MD 3/13/201410:26 AM

## 2013-02-27 NOTE — Telephone Encounter (Signed)
Gave pt appt for lab and MD on July 2014 °

## 2013-03-13 ENCOUNTER — Telehealth: Payer: Self-pay | Admitting: Neurology

## 2013-03-13 NOTE — Telephone Encounter (Addendum)
Son called about tapering of medication (mentioned plavix?  Ropinirole).  Looking at note, taper sinemet then increase ropinirole.  [phone]. I called and LMVM again this am for pt or son to call me back. 03-14-13 1013

## 2013-05-06 ENCOUNTER — Encounter: Payer: Self-pay | Admitting: Neurology

## 2013-07-01 ENCOUNTER — Ambulatory Visit: Payer: Medicare Other | Admitting: Oncology

## 2013-07-01 ENCOUNTER — Other Ambulatory Visit: Payer: Medicare Other | Admitting: Lab

## 2013-07-14 ENCOUNTER — Telehealth: Payer: Self-pay | Admitting: Oncology

## 2013-07-14 NOTE — Telephone Encounter (Signed)
pt called to r/s missed appt....Done °

## 2013-07-24 ENCOUNTER — Telehealth: Payer: Self-pay | Admitting: *Deleted

## 2013-07-24 ENCOUNTER — Other Ambulatory Visit (HOSPITAL_BASED_OUTPATIENT_CLINIC_OR_DEPARTMENT_OTHER): Payer: Medicare Other | Admitting: Lab

## 2013-07-24 ENCOUNTER — Ambulatory Visit (HOSPITAL_BASED_OUTPATIENT_CLINIC_OR_DEPARTMENT_OTHER): Payer: Medicare Other | Admitting: Oncology

## 2013-07-24 VITALS — BP 178/78 | HR 63 | Temp 97.7°F | Resp 20 | Ht 71.0 in | Wt 171.5 lb

## 2013-07-24 DIAGNOSIS — Z8673 Personal history of transient ischemic attack (TIA), and cerebral infarction without residual deficits: Secondary | ICD-10-CM

## 2013-07-24 DIAGNOSIS — C649 Malignant neoplasm of unspecified kidney, except renal pelvis: Secondary | ICD-10-CM

## 2013-07-24 DIAGNOSIS — C801 Malignant (primary) neoplasm, unspecified: Secondary | ICD-10-CM

## 2013-07-24 DIAGNOSIS — Z85528 Personal history of other malignant neoplasm of kidney: Secondary | ICD-10-CM

## 2013-07-24 DIAGNOSIS — N189 Chronic kidney disease, unspecified: Secondary | ICD-10-CM

## 2013-07-24 LAB — COMPREHENSIVE METABOLIC PANEL (CC13)
Albumin: 3.5 g/dL (ref 3.5–5.0)
BUN: 19.3 mg/dL (ref 7.0–26.0)
Calcium: 9.8 mg/dL (ref 8.4–10.4)
Chloride: 104 mEq/L (ref 98–109)
Glucose: 210 mg/dl — ABNORMAL HIGH (ref 70–140)
Potassium: 4.6 mEq/L (ref 3.5–5.1)

## 2013-07-24 LAB — CBC WITH DIFFERENTIAL/PLATELET
BASO%: 0.3 % (ref 0.0–2.0)
EOS%: 4.5 % (ref 0.0–7.0)
HCT: 43.2 % (ref 38.4–49.9)
MCH: 33.2 pg (ref 27.2–33.4)
MCHC: 33.8 g/dL (ref 32.0–36.0)
NEUT%: 60.2 % (ref 39.0–75.0)
RBC: 4.4 10*6/uL (ref 4.20–5.82)
lymph#: 0.9 10*3/uL (ref 0.9–3.3)

## 2013-07-24 NOTE — Telephone Encounter (Signed)
appts made and printed. Pt is aware that cs will call with the appt d/t for his CT abd and CT chest...td

## 2013-07-24 NOTE — Progress Notes (Signed)
Hematology and Oncology Follow Up Visit  Frederick Tucker [id number] 30-Jul-1924 79 y.o. 07/24/2013 1:56 PM    CC: [name], M.D. [name], M.D.    Principle Diagnosis: An 79 year old gentleman with renal cell carcinoma diagnosed in 1998, metastatic disease, and confirmed in 2009.   Prior Therapy: 1. Status post left nephrectomy in 1998.  He had a T2 N0 clear cell histology. 2. The patient developed periaortic lymphadenopathy, biopsy-proven, in May 2009.  He is continued on active surveillance and due to indolent nature of his disease, he has not been on anticancer treatment.  SECONDARY DIAGNOSES:  History of CVA as well as other comorbid conditions.  Current therapy: Observation and surveillance.   Interim History:   Frederick Tucker presents today for a followup visit.  He has continued to have good overall health status.  He is not reporting any recent hospitalization, is not reporting any recent illnesses, is not reporting any deterioration in his health.  His mobility is improving slightly.  He is not reporting any abdominal pain.  He has not reported any constitutional symptoms or change in his performance status.He did not report any flank pain or bleeding from his GU tract.No pain reported at any level. He is eating better. His ambulation is using a walker and continued to be stable.    Medications: I have reviewed the patient's current medications.  Current Outpatient Prescriptions  Medication Sig Dispense Refill  . carbidopa-levodopa (SINEMET IR) 25-100 MG per tablet Take 25-100 tablets by mouth daily.      . cimetidine (TAGAMET) 200 MG tablet Take 200 mg by mouth 4 (four) times daily.        . clopidogrel (PLAVIX) 75 MG tablet Take 75 mg by mouth daily.        Marland Kitchen glimepiride (AMARYL) 4 MG tablet Take 4 mg by mouth 2 (two) times daily.       Marland Kitchen lisinopril (PRINIVIL,ZESTRIL) 10 MG tablet Take 40 mg by mouth daily.       . metFORMIN (GLUCOPHAGE) 500 MG tablet Take  500 mg by mouth 2 (two) times daily with a meal.        . omeprazole (PRILOSEC) 40 MG capsule Take 40 mg by mouth daily at 3 pm.      . PARoxetine (PAXIL) 20 MG tablet Take 20 mg by mouth every morning.        . propranolol (INDERAL) 20 MG tablet Take 20 mg by mouth 2 (two) times daily.        Marland Kitchen rOPINIRole (REQUIP) 1 MG tablet Take 1 mg by mouth daily.      Marland Kitchen topiramate (TOPAMAX) 50 MG tablet Take 50 mg by mouth as directed.        . triamterene-hydrochlorothiazide (MAXZIDE-25) 37.5-25 MG per tablet Take 1 tablet by mouth daily.        Marland Kitchen warfarin (COUMADIN) 5 MG tablet Take 5 mg by mouth daily.         No current facility-administered medications for this visit.    Allergies: No Known Allergies  Past Medical History, Surgical history, Social history, and Family History were reviewed and updated.  Review of Systems: Constitutional:  Negative for fever, chills, night sweats, anorexia, weight loss, pain. Cardiovascular: no chest pain or dyspnea on exertion Respiratory: no cough, shortness of breath, or wheezing Neurological: no TIA or stroke symptoms Dermatological: negative ENT: negative Skin: Negative. Gastrointestinal: no abdominal pain, change in bowel habits, or black or bloody stools Genito-Urinary:  no dysuria, trouble voiding, or hematuria Hematological and Lymphatic: negative Breast: negative Musculoskeletal: negative Remaining ROS negative. Physical Exam: Blood pressure 178/78, pulse 63, temperature 97.7 F (36.5 C), temperature source Oral, resp. rate 20, height 5\' 11"  (1.803 m), weight 171 lb 8 oz (77.792 kg). ECOG: 2 General appearance: alert Head: Normocephalic, without obvious abnormality, atraumatic Neck: no adenopathy, no carotid bruit, no JVD, supple, symmetrical, trachea midline and thyroid not enlarged, symmetric, no tenderness/mass/nodules Lymph nodes: Cervical, supraclavicular, and axillary nodes normal. Heart:regular rate and rhythm, S1, S2 normal, no murmur,  click, rub or gallop Lung:chest clear, no wheezing, rales, normal symmetric air entry Abdomin: soft, non-tender, without masses or organomegaly EXT:no erythema, induration, or nodules   Lab Results: Lab Results  Component Value Date   WBC 3.8* 07/24/2013   HGB 14.6 07/24/2013   HCT 43.2 07/24/2013   MCV 98.3* 07/24/2013   PLT 132* 07/24/2013     Chemistry      Component Value Date/Time   NA 139 07/24/2013 1256   NA 142 07/15/2012 1008   NA 142 03/14/2012 0956   K 4.6 07/24/2013 1256   K 4.8* 07/15/2012 1008   K 4.6 03/14/2012 0956   CL 101 02/25/2013 0922   CL 100 07/15/2012 1008   CL 102 03/14/2012 0956   CO2 29 07/24/2013 1256   CO2 29 07/15/2012 1008   CO2 33* 03/14/2012 0956   BUN 19.3 07/24/2013 1256   BUN 23* 07/15/2012 1008   BUN 24* 03/14/2012 0956   CREATININE 1.3 07/24/2013 1256   CREATININE 1.3* 07/15/2012 1008   CREATININE 1.38* 03/14/2012 0956      Component Value Date/Time   CALCIUM 9.8 07/24/2013 1256   CALCIUM 9.6 07/15/2012 1008   CALCIUM 10.5 03/14/2012 0956   ALKPHOS 62 07/24/2013 1256   ALKPHOS 75 07/15/2012 1008   ALKPHOS 71 03/14/2012 0956   AST 17 07/24/2013 1256   AST 27 07/15/2012 1008   AST 23 03/14/2012 0956   ALT 16 07/24/2013 1256   ALT 26 07/15/2012 1008   ALT 9 03/14/2012 0956   BILITOT 0.54 07/24/2013 1256   BILITOT 0.60 07/15/2012 1008   BILITOT 0.5 03/14/2012 0956     Impression and Plan:  This is a pleasant 79 year old gentleman with the following issues. 1. Renal cell carcinoma.  He has stage IV disease.  He has periaortic lymphadenopathy and been monitored for the last 5 years. Last CT scan obtained in 02/2013 was reviewed and continue to show that the disease had been rather unchanged. Unfortunately, Frederick Tucker is not the greatest of shape to withstand any specific anticancer treatments in the last few months given his history of cerebrovascular accident and poor health. There are no indication for treatment at this time and will plan continue observation and surveillance. I  will repeat CT scan in 01/2014.  2. Chronic renal insufficiency is currently stable. 3. Cerebrovascular accident and overall performance status seems to be improving slowly. 4. Follow up in 6 months and scan then.     Eli Hose, MD 8/7/20141:56 PM

## 2013-09-11 ENCOUNTER — Ambulatory Visit (INDEPENDENT_AMBULATORY_CARE_PROVIDER_SITE_OTHER): Payer: Medicare Other | Admitting: Neurology

## 2013-09-11 ENCOUNTER — Encounter: Payer: Self-pay | Admitting: Neurology

## 2013-09-11 VITALS — BP 180/76 | HR 63 | Temp 98.0°F | Ht 71.0 in | Wt 172.0 lb

## 2013-09-11 DIAGNOSIS — E119 Type 2 diabetes mellitus without complications: Secondary | ICD-10-CM

## 2013-09-11 DIAGNOSIS — Z87898 Personal history of other specified conditions: Secondary | ICD-10-CM

## 2013-09-11 DIAGNOSIS — R269 Unspecified abnormalities of gait and mobility: Secondary | ICD-10-CM

## 2013-09-11 DIAGNOSIS — G20 Parkinson's disease: Secondary | ICD-10-CM

## 2013-09-11 DIAGNOSIS — Z8679 Personal history of other diseases of the circulatory system: Secondary | ICD-10-CM

## 2013-09-11 DIAGNOSIS — G609 Hereditary and idiopathic neuropathy, unspecified: Secondary | ICD-10-CM

## 2013-09-11 DIAGNOSIS — Z8669 Personal history of other diseases of the nervous system and sense organs: Secondary | ICD-10-CM

## 2013-09-11 DIAGNOSIS — Z8673 Personal history of transient ischemic attack (TIA), and cerebral infarction without residual deficits: Secondary | ICD-10-CM

## 2013-09-11 NOTE — Progress Notes (Signed)
Subjective:    Patient ID: Frederick Tucker is a 79 y.o. male.  HPI  Interim history:   Frederick Tucker is a very pleasant 79 year old right-handed gentleman who presents for initial consultation with me for his history of parkinsonism, referred by my colleague, Dr. Pearlean Brownie. The patient is accompanied by his son, Frederick Tucker, today. He previously followed with Dr. Pearlean Brownie and was last seen by him on 02/26/2013, at which time Dr. Pearlean Brownie suggested tapering off his carbidopa-levodopa because of daytime somnolence and orthostatic dizziness reported. He has not discontinued his C/L. Dr. Pearlean Brownie increased his Requip to 1.5 mg 3 times a day , but the patient indicates, that he did not increase the requip. Essentially, I don't think he made any of the changes suggested. His son is not sure. The patient stays with Frederick Tucker during the week and is in his own home on the weekends. He does not drive. he has 3 sons. He does not drive. The patient has any underlying history of kidney cancer, subdural hematomas and 2010 and multiple subcortical infarcts, DM. He also has a remote history of seizures now off Dilantin without recent concern for seizure activity. He has a medical history of hypertension, type 2 diabetes, cancer, atrial fibrillation, diabetes and hyperlipidemia and is status post left kidney removal in 1998 for cancer and cholecystectomy in 1980. His current medications are Requip, Plavix 75 mg daily, glimepiride, simvastatin, lisinopril. He stopped coumadin, cimetidine, topamax, paxil.  His Sx date back to 63-20 y ago with R hand tremor. He has fallen 2 times in the last 18 months. He has been using a cane, but not inside the house. He has a walker but does not use it.   His Past Medical History Is Significant For: Past Medical History  Diagnosis Date  . recurrent lt renal ca dx'd 1998/05/2008    surg only  . Diabetes mellitus   . Hypertension   . Bradycardia   . Persistent atrial fibrillation   . Renal cell carcinoma      His Past Surgical History Is Significant For: Past Surgical History  Procedure Laterality Date  . Cholecystectomy    . Renal resection      His Family History Is Significant For: No family history on file.  His Social History Is Significant For: History   Social History  . Marital Status: Widowed    Spouse Name: N/A    Number of Children: N/A  . Years of Education: N/A   Social History Main Topics  . Smoking status: Former Smoker    Types: Cigars    Quit date: 12/18/1958  . Smokeless tobacco: None  . Alcohol Use: No  . Drug Use: No  . Sexual Activity: None   Other Topics Concern  . None   Social History Narrative  . None    His Allergies Are:  No Known Allergies:   His Current Medications Are:  Outpatient Encounter Prescriptions as of 09/11/2013  Medication Sig Dispense Refill  . carbidopa-levodopa (SINEMET IR) 25-100 MG per tablet Take 25-100 tablets by mouth daily.      . clopidogrel (PLAVIX) 75 MG tablet Take 75 mg by mouth daily.        Marland Kitchen glimepiride (AMARYL) 4 MG tablet Take 4 mg by mouth 2 (two) times daily.       Marland Kitchen lisinopril (PRINIVIL,ZESTRIL) 10 MG tablet Take 40 mg by mouth daily.       . metFORMIN (GLUCOPHAGE) 500 MG tablet Take 500 mg by mouth 2 (  two) times daily with a meal.        . omeprazole (PRILOSEC) 40 MG capsule Take 40 mg by mouth daily at 3 pm.      . rOPINIRole (REQUIP) 1 MG tablet Take 1 mg by mouth daily.      . simvastatin (ZOCOR) 20 MG tablet       . cimetidine (TAGAMET) 200 MG tablet Take 200 mg by mouth 4 (four) times daily.        Marland Kitchen PARoxetine (PAXIL) 20 MG tablet Take 20 mg by mouth every morning.        . [DISCONTINUED] propranolol (INDERAL) 20 MG tablet Take 20 mg by mouth 2 (two) times daily.        . [DISCONTINUED] topiramate (TOPAMAX) 50 MG tablet Take 50 mg by mouth as directed.        . [DISCONTINUED] triamterene-hydrochlorothiazide (MAXZIDE-25) 37.5-25 MG per tablet Take 1 tablet by mouth daily.        . [DISCONTINUED]  warfarin (COUMADIN) 5 MG tablet Take 5 mg by mouth daily.         No facility-administered encounter medications on file as of 09/11/2013.  : Review of Systems  Neurological: Positive for tremors.    Objective:  Neurologic Exam  Physical Exam Physical Examination:   Filed Vitals:   09/11/13 1100  BP: 180/76  Pulse: 63  Temp: 98 F (36.7 C)    General Examination: The patient is a very pleasant 79 y.o. male in no acute distress.  HEENT: Normocephalic, atraumatic, pupils are equal, round and reactive to light and accommodation. Funduscopic exam is normal with sharp disc margins noted. Extraocular tracking shows moderate saccadic breakdown without nystagmus noted. There is limitation to upper gaze. There is mild decrease in eye blink rate. Hearing is impaired. Face is symmetric with mild facial masking and normal facial sensation. There is no lip, neck or jaw tremor. Neck is moderately rigid with intact passive ROM. There are no carotid bruits on auscultation. Oropharynx exam reveals mild mouth dryness. No significant airway crowding is noted. Mallampati is class II. Tongue protrudes centrally and palate elevates symmetrically.   There is no drooling.   Chest: is clear to auscultation without wheezing, rhonchi or crackles noted.  Heart: sounds are regular and normal without murmurs, rubs or gallops noted.   Abdomen: is soft, non-tender and non-distended with normal bowel sounds appreciated on auscultation.  Extremities: There is no pitting edema in the distal lower extremities bilaterally. Pedal pulses are intact. There are no varicose veins.  Skin: is warm and dry with no trophic changes noted. Age-related changes are noted on the skin.   Musculoskeletal: exam reveals no obvious joint deformities, tenderness, joint swelling or erythema.  Neurologically:  Mental status: The patient is awake and alert, paying good  attention. He is able to partially provide the history. His son  provides details. He is oriented to: person, place, time/date, situation, day of week, month of year and year. His memory, attention, language and knowledge are impaired mildly. There is no aphasia, agnosia, apraxia or anomia. There is a mild degree of bradyphrenia. Speech is moderately hypophonic with minimal dysarthria noted. Mood is congruent and affect is normal.   Cranial nerves are as described above under HEENT exam. In addition, shoulder shrug is normal with equal shoulder height noted.  Motor exam: Normal bulk, and strength for age is noted. There are no dyskinesias noted. Tone is mildly rigid with presence of cogwheeling in the right upper  extremity. There is overall mild bradykinesia. There is no drift or rebound.  There is a mild resting tremor in the right upper extremity and a no resting tremor in the right lower extremity. The tremor is intermittent.  Romberg is negative.  Reflexes are 1+ in the upper extremities and 1+ in the lower extremities. Fine motor skills exam: Finger taps are moderately impaired on the right and moderately impaired on the left. Hand movements are moderately impaired on the right and mildly impaired on the left. RAP (rapid alternating patting) is mildly impaired on the right and mildly impaired on the left. Foot taps are moderately impaired on the right and moderately impaired on the left. Foot agility (in the form of heel stomping) is moderately impaired on the right and moderately impaired on the left.    Cerebellar testing shows no dysmetria or intention tremor on finger to nose testing. Heel to shin is unremarkable bilaterally. There is no truncal or gait ataxia.   Sensory exam is intact to light touch, pinprick, vibration, temperature sense and proprioception in the upper extremities and decreased to all modalities in the right more so the left lower extremities.   Gait, station and balance: He stands up from the seated position with mild difficulty and does  need to push up with His hands. He needs no assistance. No veering to one side is noted. He is not noted to lean to the side. Posture is moderately stooped. Stance is wide-based. He walks with decrease in stride length and pace and decreased arm swing on both sides mildly. He turns in 4 steps. Tandem walk is not possible. Balance is mildly impaired. He is not able to do a toe or heel stance.     Assessment and Plan:   In summary, Frederick Tucker is a very pleasant 79 y.o.-year old male with a complex history including kidney cancer, stroke, SDH, Sz and parkinsonism/Parkinson's disease, possibly right-sided predominant, but confounded by perhaps vascular gait disorder. I do not think the picture is completely clear with regards to whether he has more in the realm of parkinsonism or true idiopathic Parkinson's disease. His picture is complicated by his prior intracranial hemorrhages and strokes. He has some peripheral neuropathy versus residual numbness from prior strokes. His physical exam is stable and has not progressed in the last 6 months. He is doing fairly well at this time and I reassured the patient and his son in that regard.  I had a long chat with the patient and his son about His symptoms, my findings and the diagnosis of parkinsonism/Parksinson's disease, its prognosis and treatment options. We talked about medical treatments and non-pharmacological approaches. We talked about maintaining a healthy lifestyle in general. I encouraged the patient to eat healthy, exercise daily and keep well hydrated, to keep a scheduled bedtime and wake time routine, to not skip any meals and eat healthy snacks in between meals and to have protein with every meal. In particular, I stressed the importance of regular exercise, within of course the patient's own mobility limitations.   As far as further diagnostic testing is concerned, I suggested: no change.  As far as medications are concerned, I recommended the  following at this time: no change. I don't think exact clear just what he is taking at this moment. Therefore, I have asked his son to call us back with the exact list of his medications, in particular how he is taking the Sinemet and Requip at this time. We may  make some adjustments over the phone. I want him to use his cane at all times because he is at risk for falling.  I answered all their questions today and the patient and his son were in agreement with the above outlined plan. I would like to see the patient back in 3 months, sooner if the need arises and encouraged  them to call with any interim questions, concerns, problems or updates and refill requests.

## 2013-09-11 NOTE — Patient Instructions (Addendum)
I think overall you are doing fairly well but I do want to suggest a few things today:  Remember to drink plenty of fluid, eat healthy meals and do not skip any meals. Try to eat protein with a every meal and eat a healthy snack such as fruit or nuts in between meals. Try to keep a regular sleep-wake schedule and try to exercise daily, particularly in the form of walking, 20-30 minutes a day, if you can.   Engage in social activities in your community and with your family and try to keep up with current events by reading the newspaper or watching the news.   As far as your medications are concerned, I would like to suggest no changes in your meds. Please call back with exact list of meds.    As far as diagnostic testing: no new test at this time.   Use your cane at all times, you are at risk for falls.   I would like to see you back in 3 months, sooner if we need to. Please call us with any interim questions, concerns, problems, updates or refill requests.  Please also call us for any test results so we can go over those with you on the phone. Brett Canales is my clinical assistant and will answer any of your questions and relay your messages to me and also relay most of my messages to you.  Our phone number is [phone]. We also have an after hours call service for urgent matters and there is a physician on-call for urgent questions. For any emergencies you know to call 911 or go to the nearest emergency room.

## 2013-10-16 ENCOUNTER — Other Ambulatory Visit: Payer: Self-pay | Admitting: Neurology

## 2013-10-16 DIAGNOSIS — C649 Malignant neoplasm of unspecified kidney, except renal pelvis: Secondary | ICD-10-CM

## 2013-10-16 MED ORDER — ROPINIROLE HCL 1 MG PO TABS: 1.0000 mg | ORAL_TABLET | Freq: Every day | ORAL | Status: DC

## 2013-12-09 ENCOUNTER — Ambulatory Visit (INDEPENDENT_AMBULATORY_CARE_PROVIDER_SITE_OTHER): Payer: Medicare Other | Admitting: Neurology

## 2013-12-09 ENCOUNTER — Encounter (INDEPENDENT_AMBULATORY_CARE_PROVIDER_SITE_OTHER): Payer: Self-pay

## 2013-12-09 ENCOUNTER — Encounter: Payer: Self-pay | Admitting: Neurology

## 2013-12-09 VITALS — BP 188/73 | HR 57 | Ht 71.0 in | Wt 171.0 lb

## 2013-12-09 DIAGNOSIS — G609 Hereditary and idiopathic neuropathy, unspecified: Secondary | ICD-10-CM

## 2013-12-09 DIAGNOSIS — C801 Malignant (primary) neoplasm, unspecified: Secondary | ICD-10-CM

## 2013-12-09 DIAGNOSIS — R2689 Other abnormalities of gait and mobility: Secondary | ICD-10-CM

## 2013-12-09 DIAGNOSIS — R269 Unspecified abnormalities of gait and mobility: Secondary | ICD-10-CM

## 2013-12-09 DIAGNOSIS — Z8679 Personal history of other diseases of the circulatory system: Secondary | ICD-10-CM

## 2013-12-09 DIAGNOSIS — C649 Malignant neoplasm of unspecified kidney, except renal pelvis: Secondary | ICD-10-CM

## 2013-12-09 DIAGNOSIS — G20 Parkinson's disease: Secondary | ICD-10-CM

## 2013-12-09 DIAGNOSIS — Z8669 Personal history of other diseases of the nervous system and sense organs: Secondary | ICD-10-CM

## 2013-12-09 DIAGNOSIS — Z8673 Personal history of transient ischemic attack (TIA), and cerebral infarction without residual deficits: Secondary | ICD-10-CM

## 2013-12-09 DIAGNOSIS — Z87898 Personal history of other specified conditions: Secondary | ICD-10-CM

## 2013-12-09 MED ORDER — CARBIDOPA-LEVODOPA 25-100 MG PO TABS: 25.0000 | ORAL_TABLET | Freq: Three times a day (TID) | ORAL | Status: DC

## 2013-12-09 MED ORDER — ROPINIROLE HCL 1 MG PO TABS: 1.0000 mg | ORAL_TABLET | Freq: Every day | ORAL | Status: DC

## 2013-12-09 NOTE — Patient Instructions (Addendum)
I think overall you are doing fairly well but I do want to suggest a few things today:  Remember to drink plenty of fluid, eat healthy meals and do not skip any meals. Try to eat protein with a every meal and eat a healthy snack such as fruit or nuts in between meals. Try to keep a regular sleep-wake schedule and try to exercise daily, particularly in the form of walking, 10-20 minutes a day, if you can.   Engage in social activities in your community and with your family and try to keep up with current events by reading the newspaper or watching the news.   As far as your medications are concerned, I would like to suggest taking sinemet 25/100 mg three times day, reducing to 1 pill three times a day, 2 hours after your meals. The Requip will stay at 1 mg once daily in AM for now.    As far as diagnostic testing: no new test.   I would like to see you back in 3 to 4 months, sooner if we need to. Please call us with any interim questions, concerns, problems, updates or refill requests.  Brett Canales is my clinical assistant and will answer any of your questions and relay your messages to me and also relay most of my messages to you.  Our phone number is [phone]. We also have an after hours call service for urgent matters and there is a physician on-call for urgent questions. For any emergencies you know to call 911 or go to the nearest emergency room.

## 2013-12-09 NOTE — Progress Notes (Signed)
Subjective:    Patient ID: Frederick Tucker is a 79 y.o. male.  HPI    Interim history:   Frederick Tucker is a very pleasant 79 year old right-handed gentleman who presents for followup consultation of his consultation with me for his history of parkinsonism and gait disorder. The patient is accompanied by his son again today. I first met him on 09/11/2013 at the request of my colleague, Dr. Pearlean Brownie, at which time I felt the patient had parkinsonism, confounded by most likely vascular gait disorder, his history of intracranial hemorrhage and stroke, seizure disorder and overall complex medical history including neuropathy. I suggested no medication changes at the time. I asked him to use his cane at all times for risk of falling.  Today, he reports feeling well, no recent falls, no new issues, he has been using his cane more consistently, but not always at home. He does not exercise. He takes C/L 1.5 pills tid with his meals. He is not sure if the carbidopa-levodopa is helping. He is taking Requip 1 mg just once in the morning.  He previously followed with Dr. Pearlean Brownie and was last seen by him on 02/26/2013, at which time Dr. Pearlean Brownie suggested tapering off his carbidopa-levodopa because of daytime somnolence and orthostatic dizziness reported. He has not discontinued his C/L. Dr. Pearlean Brownie increased his Requip to 1.5 mg 3 times a day , but the patient indicated, that he did not increase the requip. Essentially, no changes were made. He does not drive.  The patient has any underlying history of kidney cancer, subdural hematomas and 2010 and multiple subcortical infarcts, DM. He also has a remote history of seizures now off Dilantin without recent concern for seizure activity. He has a medical history of hypertension, type 2 diabetes, cancer, atrial fibrillation, diabetes and hyperlipidemia and is status post left kidney removal in 1998 for cancer and cholecystectomy in 1980. His current medications are Requip, Plavix 75  mg daily, glimepiride, simvastatin, lisinopril. He stopped coumadin, cimetidine, topamax, paxil.  His Sx date back to 68-20 y ago with R hand tremor. He has fallen in the past.   His Past Medical History Is Significant For: Past Medical History  Diagnosis Date  . recurrent lt renal ca dx'd 1998/05/2008    surg only  . Diabetes mellitus   . Hypertension   . Bradycardia   . Persistent atrial fibrillation   . Renal cell carcinoma     His Past Surgical History Is Significant For: Past Surgical History  Procedure Laterality Date  . Cholecystectomy    . Renal resection      His Family History Is Significant For: History reviewed. No pertinent family history.  His Social History Is Significant For: History   Social History  . Marital Status: Widowed    Spouse Name: N/A    Number of Children: 3  . Years of Education: college   Occupational History  . retired    Social History Main Topics  . Smoking status: Former Smoker    Types: Cigars    Quit date: 12/18/1958  . Smokeless tobacco: None  . Alcohol Use: No  . Drug Use: No  . Sexual Activity: None   Other Topics Concern  . None   Social History Narrative   Pt live at home with son   Pt is right handed   Education college    Caffeine states that he drinks caffeine free tea    His Allergies Are:  No Known Allergies:   His  Current Medications Are:  Outpatient Encounter Prescriptions as of 12/09/2013  Medication Sig  . carbidopa-levodopa (SINEMET IR) 25-100 MG per tablet Take 25-100 tablets by mouth daily.  . clotrimazole-betamethasone (LOTRISONE) cream Apply 1 application topically as needed.  Marland Kitchen glimepiride (AMARYL) 4 MG tablet Take 4 mg by mouth 2 (two) times daily.   Marland Kitchen lisinopril (PRINIVIL,ZESTRIL) 10 MG tablet Take 40 mg by mouth daily.   . metFORMIN (GLUCOPHAGE) 500 MG tablet Take 500 mg by mouth 2 (two) times daily with a meal.    . omeprazole (PRILOSEC) 40 MG capsule Take 40 mg by mouth daily at 3 pm.  .  PARoxetine (PAXIL) 20 MG tablet Take 20 mg by mouth every morning.    Marland Kitchen rOPINIRole (REQUIP) 1 MG tablet Take 1 tablet (1 mg total) by mouth daily.  . [DISCONTINUED] cimetidine (TAGAMET) 200 MG tablet Take 200 mg by mouth 4 (four) times daily.    . [DISCONTINUED] clopidogrel (PLAVIX) 75 MG tablet Take 75 mg by mouth daily.    . [DISCONTINUED] simvastatin (ZOCOR) 20 MG tablet   :  Review of Systems:  Out of a complete 14 point review of systems, all are reviewed and negative with the exception of these symptoms as listed below:   Review of Systems  Constitutional: Negative.   HENT: Negative.   Eyes: Negative.   Respiratory: Negative.   Cardiovascular: Negative.   Gastrointestinal: Negative.   Endocrine: Positive for cold intolerance.  Genitourinary: Negative.   Musculoskeletal: Negative.   Skin: Negative.   Allergic/Immunologic: Negative.   Neurological: Negative.   Hematological: Negative.   Psychiatric/Behavioral: Negative.     Objective:  Neurologic Exam  Physical Exam Physical Examination:   Filed Vitals:   12/09/13 1058  BP: 188/73  Pulse: 57    General Examination: The patient is a very pleasant 79 y.o. male in no acute distress. He appears frail and mildly deconditioned.   HEENT: Normocephalic, atraumatic, pupils are equal, round and reactive to light and accommodation. Funduscopic exam is normal with sharp disc margins noted. Extraocular tracking shows moderate saccadic breakdown without nystagmus noted. There is limitation to upper gaze. There is mild decrease in eye blink rate. Hearing is impaired. Face is symmetric with mild facial masking and normal facial sensation. There is no lip, neck or jaw tremor. Neck is moderately rigid with intact passive ROM. There are no carotid bruits on auscultation. Oropharynx exam reveals mild mouth dryness. No significant airway crowding is noted. Mallampati is class II. Tongue protrudes centrally and palate elevates symmetrically.    There is no drooling.   Chest: is clear to auscultation without wheezing, rhonchi or crackles noted.  Heart: sounds are regular and normal without murmurs, rubs or gallops noted.   Abdomen: is soft, non-tender and non-distended with normal bowel sounds appreciated on auscultation.  Extremities: There is no pitting edema in the distal lower extremities bilaterally. Pedal pulses are intact. There are no varicose veins.  Skin: is warm and dry with no trophic changes noted. Age-related changes are noted on the skin.   Musculoskeletal: exam reveals no obvious joint deformities, tenderness, joint swelling or erythema.  Neurologically:  Mental status: The patient is awake and alert, paying good  attention. He is able to partially provide the history. His son provides details. He is oriented to: person, place, time/date, situation, day of week, month of year and year. His memory, attention, language and knowledge are impaired mildly, unchanged. There is no aphasia, agnosia, apraxia or anomia. There is a  mild degree of bradyphrenia. Speech is moderately hypophonic with minimal dysarthria noted. Mood is congruent and affect is normal.   Cranial nerves are as described above under HEENT exam. In addition, shoulder shrug is normal with equal shoulder height noted.  Motor exam: Normal bulk, and strength for age is noted. There are no dyskinesias noted. Tone is mildly rigid with presence of cogwheeling in the right upper extremity. There is overall mild bradykinesia. There is no drift or rebound.  There is no resting tremor today.  Romberg is negative.  Reflexes are 1+ in the upper extremities and 1+ in the lower extremities. Fine motor skills exam: Finger taps are moderately impaired on the right and moderately impaired on the left. Hand movements are moderately impaired on the right and mildly impaired on the left. RAP (rapid alternating patting) is mildly impaired on the right and mildly impaired on the  left. Foot taps are moderately impaired on the right and moderately impaired on the left. Foot agility (in the form of heel stomping) is moderately impaired on the right and moderately impaired on the left.    Cerebellar testing shows no dysmetria or intention tremor on finger to nose testing. Heel to shin is unremarkable bilaterally. There is no truncal or gait ataxia.   Sensory exam is intact to light touch, pinprick, vibration, temperature sense and proprioception in the upper extremities and decreased to all modalities in the right more so the left lower extremities.   Gait, station and balance: He stands up from the seated position with mild difficulty and does need to push up with His hands. He needs no assistance. No veering to one side is noted. He is not noted to lean to the side. Posture is moderately stooped. Stance is wide-based. He walks with decrease in stride length and pace and decreased arm swing on both sides mildly. He turns in 4 steps. Tandem walk is not possible. Balance is mildly impaired. He is not able to do a toe or heel stance.     Assessment and Plan:   In summary, Frederick Tucker is a very pleasant 79 y.o.-year old male with a complex history including kidney cancer, stroke, SDH, Sz and parkinsonism, neuropathy and gait disorder. I  am not convinced that he has idiopathic Parkinson's disease. His picture is complicated by his prior intracranial hemorrhages and strokes. His physical exam is stable and has not progressed in the last 5 months. He is doing fairly well at this time and I reassured the patient and his son in that regard.  I had a long chat with the patient and his son about His symptoms, my findings and the diagnosis of multifactorial gait disorder and parkinsonism. I would like for him to take his Sinemet 3 times a day but skills back to just one pill 3 times a day and I have advised him to not taken with results meals to ensure better absorption. I encouraged him  to drink more fluid and exercise regularly in the form of walking. We talked about maintaining a healthy lifestyle in general. I encouraged the patient to eat healthy, exercise daily and keep well hydrated, to keep a scheduled bedtime and wake time routine, to not skip any meals and eat healthy snacks in between meals and to have protein with every meal. In particular, I stressed the importance of regular exercise, within of course the patient's own mobility limitations. He is encouraged to use his cane consistently and walker if needed. As far  as further diagnostic testing is concerned, I suggested: no change.  As far as medications are concerned, I recommended the following at this time: I answered all their questions today and the patient and his son were in agreement with the above outlined plan. I would like to see the patient back in 3 months, sooner if the need arises and encouraged  them to call with any interim questions, concerns, problems or updates and refill requests.

## 2014-01-27 ENCOUNTER — Other Ambulatory Visit (HOSPITAL_BASED_OUTPATIENT_CLINIC_OR_DEPARTMENT_OTHER): Payer: Medicare Other

## 2014-01-27 ENCOUNTER — Ambulatory Visit (HOSPITAL_COMMUNITY)
Admission: RE | Admit: 2014-01-27 | Discharge: 2014-01-27 | Disposition: A | Discharge status: Home / Self Care | Payer: Medicare Other | Source: Ambulatory Visit | Attending: Oncology | Admitting: Oncology

## 2014-01-27 DIAGNOSIS — R1909 Other intra-abdominal and pelvic swelling, mass and lump: Secondary | ICD-10-CM | POA: Insufficient documentation

## 2014-01-27 DIAGNOSIS — C649 Malignant neoplasm of unspecified kidney, except renal pelvis: Secondary | ICD-10-CM

## 2014-01-27 DIAGNOSIS — Z905 Acquired absence of kidney: Secondary | ICD-10-CM | POA: Insufficient documentation

## 2014-01-27 DIAGNOSIS — K869 Disease of pancreas, unspecified: Secondary | ICD-10-CM | POA: Insufficient documentation

## 2014-01-27 DIAGNOSIS — Z9089 Acquired absence of other organs: Secondary | ICD-10-CM | POA: Insufficient documentation

## 2014-01-27 DIAGNOSIS — N281 Cyst of kidney, acquired: Secondary | ICD-10-CM | POA: Insufficient documentation

## 2014-01-27 DIAGNOSIS — N289 Disorder of kidney and ureter, unspecified: Secondary | ICD-10-CM | POA: Insufficient documentation

## 2014-01-27 LAB — CBC WITH DIFFERENTIAL/PLATELET
BASO%: 0.3 % (ref 0.0–2.0)
BASOS ABS: 0 10*3/uL (ref 0.0–0.1)
EOS%: 3.7 % (ref 0.0–7.0)
Eosinophils Absolute: 0.1 10*3/uL (ref 0.0–0.5)
HCT: 45.4 % (ref 38.4–49.9)
HGB: 14.8 g/dL (ref 13.0–17.1)
LYMPH#: 1.4 10*3/uL (ref 0.9–3.3)
LYMPH%: 37.5 % (ref 14.0–49.0)
MCH: 31.9 pg (ref 27.2–33.4)
MCHC: 32.6 g/dL (ref 32.0–36.0)
MCV: 97.8 fL (ref 79.3–98.0)
MONO#: 0.6 10*3/uL (ref 0.1–0.9)
MONO%: 15.2 % — AB (ref 0.0–14.0)
NEUT#: 1.7 10*3/uL (ref 1.5–6.5)
NEUT%: 43.3 % (ref 39.0–75.0)
PLATELETS: 145 10*3/uL (ref 140–400)
RBC: 4.64 10*6/uL (ref 4.20–5.82)
RDW: 13.9 % (ref 11.0–14.6)
WBC: 3.8 10*3/uL — ABNORMAL LOW (ref 4.0–10.3)

## 2014-01-27 LAB — COMPREHENSIVE METABOLIC PANEL (CC13)
ALT: 6 U/L (ref 0–55)
AST: 21 U/L (ref 5–34)
Albumin: 4.1 g/dL (ref 3.5–5.0)
Alkaline Phosphatase: 83 U/L (ref 40–150)
Anion Gap: 7 meq/L (ref 3–11)
BUN: 20 mg/dL (ref 7.0–26.0)
CO2: 30 meq/L — ABNORMAL HIGH (ref 22–29)
Calcium: 10.5 mg/dL — ABNORMAL HIGH (ref 8.4–10.4)
Chloride: 104 meq/L (ref 98–109)
Creatinine: 1.3 mg/dL (ref 0.7–1.3)
Glucose: 147 mg/dL — ABNORMAL HIGH (ref 70–140)
Potassium: 4 meq/L (ref 3.5–5.1)
Sodium: 141 meq/L (ref 136–145)
Total Bilirubin: 0.74 mg/dL (ref 0.20–1.20)
Total Protein: 7.1 g/dL (ref 6.4–8.3)

## 2014-01-29 ENCOUNTER — Encounter: Payer: Self-pay | Admitting: Oncology

## 2014-01-29 ENCOUNTER — Ambulatory Visit (HOSPITAL_BASED_OUTPATIENT_CLINIC_OR_DEPARTMENT_OTHER): Payer: Medicare Other | Admitting: Oncology

## 2014-01-29 VITALS — BP 132/84 | HR 84 | Temp 97.6°F | Resp 18 | Ht 71.0 in | Wt 175.3 lb

## 2014-01-29 DIAGNOSIS — N189 Chronic kidney disease, unspecified: Secondary | ICD-10-CM

## 2014-01-29 DIAGNOSIS — Z8673 Personal history of transient ischemic attack (TIA), and cerebral infarction without residual deficits: Secondary | ICD-10-CM

## 2014-01-29 DIAGNOSIS — C649 Malignant neoplasm of unspecified kidney, except renal pelvis: Secondary | ICD-10-CM

## 2014-01-29 NOTE — Progress Notes (Signed)
Hematology and Oncology Follow Up Visit  [name]:[id number] [id number] 80 y.o. 01/29/2014 10:13 AM    CC: Frederick Tucker. Frederick Tucker, M.D. Frederick Tucker, M.D.    Principle Diagnosis: An 80 year old gentleman with renal cell carcinoma diagnosed in 1998, metastatic disease, and confirmed in 2009.   Prior Therapy: 1. Status post left nephrectomy in 1998.  He had a T2 N0 clear cell histology. 2. The patient developed periaortic lymphadenopathy, biopsy-proven, in May 2009.  He is continued on active surveillance and due to indolent nature of his disease, he has not been on anticancer treatment.  SECONDARY DIAGNOSES:  History of CVA as well as other comorbid conditions.  Current therapy: Observation and surveillance.   Interim History:   Frederick Tucker presents today for a followup visit.  He has continued to have good overall health status.  He is not reporting any recent hospitalization, is not reporting any recent illnesses, is not reporting any deterioration in his health.  His mobility is improving slightly.  He is not reporting any abdominal pain.  He has not reported any constitutional symptoms or change in his performance status.He did not report any flank pain or bleeding from his GU tract.No pain reported at any level. He is eating better. His ambulation is using a walker and continued to be stable. His performance status despite being limited has not dramatically changed. He has not reported any recent infections or hospitalizations.   Medications: I have reviewed the patient's current medications.  Current Outpatient Prescriptions  Medication Sig Dispense Refill  . carbidopa-levodopa (SINEMET IR) 25-100 MG per tablet Take 25-100 tablets by mouth 3 (three) times daily.  270 tablet  3  . clotrimazole-betamethasone (LOTRISONE) cream Apply 1 application topically as needed.      Marland Kitchen glimepiride (AMARYL) 4 MG tablet Take 4 mg by mouth 2 (two) times daily.       Marland Kitchen lisinopril  (PRINIVIL,ZESTRIL) 10 MG tablet Take 40 mg by mouth daily.       . metFORMIN (GLUCOPHAGE) 500 MG tablet Take 500 mg by mouth 2 (two) times daily with a meal.        . omeprazole (PRILOSEC) 40 MG capsule Take 40 mg by mouth daily at 3 pm.      . PARoxetine (PAXIL) 20 MG tablet Take 20 mg by mouth every morning.        Marland Kitchen rOPINIRole (REQUIP) 1 MG tablet Take 1 tablet (1 mg total) by mouth daily.  90 tablet  3   No current facility-administered medications for this visit.    Allergies: No Known Allergies  Past Medical History, Surgical history, Social history, and Family History were reviewed and updated.  Review of Systems: Constitutional:  Negative for fever, chills, night sweats, anorexia, weight loss, pain. Cardiovascular: no chest pain or dyspnea on exertion Respiratory: no cough, shortness of breath, or wheezing Neurological: no TIA or stroke symptoms Gastrointestinal: no abdominal pain, change in bowel habits, or black or bloody stools Genito-Urinary: no dysuria, trouble voiding, or hematuria Remaining ROS negative. Physical Exam: Blood pressure 132/84, pulse 84, temperature 97.6 F (36.4 C), temperature source Oral, resp. rate 18, height 5\' 11"  (1.803 m), weight 175 lb 4.8 oz (79.516 kg), SpO2 97.00%. ECOG: 2 General appearance: alert Head: Normocephalic, without obvious abnormality, atraumatic Neck: no adenopathy, no carotid bruit, no JVD, supple, symmetrical, trachea midline and thyroid not enlarged, symmetric, no tenderness/mass/nodules Lymph nodes: Cervical, supraclavicular, and axillary nodes normal. Heart:regular rate and rhythm, S1, S2 normal, no  murmur, click, rub or gallop Lung:chest clear, no wheezing, rales, normal symmetric air entry Abdomin: soft, non-tender, without masses or organomegaly EXT:no erythema, induration, or nodules   Lab Results: Lab Results  Component Value Date   WBC 3.8* 01/27/2014   HGB 14.8 01/27/2014   HCT 45.4 01/27/2014   MCV 97.8 01/27/2014    PLT 145 01/27/2014     Chemistry      Component Value Date/Time   NA 141 01/27/2014 1010   NA 142 07/15/2012 1008   NA 142 03/14/2012 0956   K 4.0 01/27/2014 1010   K 4.8* 07/15/2012 1008   K 4.6 03/14/2012 0956   CL 101 02/25/2013 0922   CL 100 07/15/2012 1008   CL 102 03/14/2012 0956   CO2 30* 01/27/2014 1010   CO2 29 07/15/2012 1008   CO2 33* 03/14/2012 0956   BUN 20.0 01/27/2014 1010   BUN 23* 07/15/2012 1008   BUN 24* 03/14/2012 0956   CREATININE 1.3 01/27/2014 1010   CREATININE 1.3* 07/15/2012 1008   CREATININE 1.38* 03/14/2012 0956      Component Value Date/Time   CALCIUM 10.5* 01/27/2014 1010   CALCIUM 9.6 07/15/2012 1008   CALCIUM 10.5 03/14/2012 0956   ALKPHOS 83 01/27/2014 1010   ALKPHOS 75 07/15/2012 1008   ALKPHOS 71 03/14/2012 0956   AST 21 01/27/2014 1010   AST 27 07/15/2012 1008   AST 23 03/14/2012 0956   ALT 6 01/27/2014 1010   ALT 26 07/15/2012 1008   ALT 9 03/14/2012 0956   BILITOT 0.74 01/27/2014 1010   BILITOT 0.60 07/15/2012 1008   BILITOT 0.5 03/14/2012 0956      EXAM:  CT CHEST, ABDOMEN AND PELVIS WITHOUT CONTRAST  TECHNIQUE:  Multidetector CT imaging of the chest, abdomen and pelvis was  performed following the standard protocol without IV contrast.  COMPARISON: CT CHEST W/O CM dated 02/25/2013  FINDINGS:  CT CHEST FINDINGS  No axillary or supraclavicular adenopathy. No mediastinal or hilar  lymphadenopathy. No pericardial fluid. Review of the lung parenchyma  demonstrates several foci of nodular pleural thickening in the left  and right lower lobes posteriorly which are not changed from prior.  No new or suspicious pulmonary nodules.  CT ABDOMEN AND PELVIS FINDINGS  Non IV contrast images demonstrate no focal hepatic lesion. Patient  status post cholecystectomy. The pancreas and spleen are normal.  Adrenal glands normal.  There are multiple vascular clips in the left nephrectomy bed. There  is a soft tissue mass with central calcifications left adjacent to  the  aorta measuring 32 x 30 mm not changed from 32 x 33 mm on prior.  Anterior to the aorta at the same level there is a peripherally  calcified 31 mm by 24 mm unchanged from 32 x 24 mm on prior. No new  retroperitoneal adenopathy or nodularity. The right kidney is  unchanged. There is a high-density cyst extending from the right  kidney measuring 12 mm unchanged from prior. A medial 9 mm lesion is  also unchanged.  Adjacent to the tail the pancreas at the splenic hilum, there is a  21 mm low-density lesion not changed from 22 mm on prior.  No free fluid in the pelvis. The prostate gland and bladder normal.  No pelvic lymphadenopathy. No aggressive osseous lesion.  IMPRESSION:  1. No evidence of thoracic metastasis.  2. Stable left nephrectomy bed. Soft tissue and peripherally  calcified masses left adjacent to the aorta are unchanged.  3. Stable  small exophytic lesions in the right kidney.  4. Stable small cystic lesion in the tail of pancreas.     Impression and Plan:  This is a pleasant 80 year old gentleman with the following issues. 1. Renal cell carcinoma.  He has stage IV disease.  He has periaortic lymphadenopathy and been monitored for the last 5 years. Last CT scan obtained on 01/27/2014 was reviewed today and continue to show that the disease had been rather unchanged. There are no indication for treatment at this time and will plan continue observation and surveillance. I will repeat CT scan in 01/2015.  2. Chronic renal insufficiency is currently stable. 3. Cerebrovascular accident and overall performance status seems to be improving slowly. 4. Follow up in 6 months.     [facility], MD 2/12/201510:13 AM

## 2014-01-30 ENCOUNTER — Telehealth: Payer: Self-pay | Admitting: Oncology

## 2014-01-30 NOTE — Telephone Encounter (Signed)
lvm for pt regarding to Aug appt....mailed pt appt sched ///avs and letter °

## 2014-05-01 ENCOUNTER — Ambulatory Visit (INDEPENDENT_AMBULATORY_CARE_PROVIDER_SITE_OTHER): Payer: Medicare Other | Admitting: Neurology

## 2014-05-01 ENCOUNTER — Encounter: Payer: Self-pay | Admitting: Neurology

## 2014-05-01 VITALS — BP 137/79 | HR 66 | Temp 97.3°F | Ht 72.0 in | Wt 170.0 lb

## 2014-05-01 DIAGNOSIS — Z8669 Personal history of other diseases of the nervous system and sense organs: Secondary | ICD-10-CM

## 2014-05-01 DIAGNOSIS — G20 Parkinson's disease: Secondary | ICD-10-CM

## 2014-05-01 DIAGNOSIS — Z8673 Personal history of transient ischemic attack (TIA), and cerebral infarction without residual deficits: Secondary | ICD-10-CM

## 2014-05-01 DIAGNOSIS — Z87898 Personal history of other specified conditions: Secondary | ICD-10-CM

## 2014-05-01 DIAGNOSIS — R269 Unspecified abnormalities of gait and mobility: Secondary | ICD-10-CM

## 2014-05-01 DIAGNOSIS — R2689 Other abnormalities of gait and mobility: Secondary | ICD-10-CM

## 2014-05-01 DIAGNOSIS — Z8679 Personal history of other diseases of the circulatory system: Secondary | ICD-10-CM

## 2014-05-01 DIAGNOSIS — G609 Hereditary and idiopathic neuropathy, unspecified: Secondary | ICD-10-CM

## 2014-05-01 NOTE — Patient Instructions (Signed)
We will taper you off of Requip: take 1/2 pill once daily for 1 week, then stop.  See your primary care physician for new leg swelling.  Drink more fluid.  See your eye doctor for concern for worsening cataract.

## 2014-05-01 NOTE — Progress Notes (Signed)
Subjective:    Patient ID: Frederick Tucker is a 80 y.o. male.  HPI    Interim history:   Frederick Tucker is a very pleasant 80 year old right-handed gentleman with a complex history of kidney cancer, stroke, SDH, Sz and parkinsonism, neuropathy and gait disorder, who presents for followup consultation of his parkinsonism and gait disorder. The patient is accompanied by his son again today. I last saw him on 11/2314, at which time I felt he had a multifactorial gait disorder and some element of parkinsonism, but not IPD. Confounding factors were prior intracranial hemorrhages and prior stroke. He is followed for his kidney cancer by oncology. I suggested he reduce Sinemet to one pill 3 times a day. I kept Requip 1 mg at 1 pill once daily.  Today, he reports he ran out of Sinemet end of April, and did not see a difference in his symptoms. He is still on ropinirole once daily. He has not fallen recently. He uses his cane and has a walker. He memory is stable. He has worse vision on the L, but has had cataracts.   I first met him on 09/11/2013 at the request of my colleague, Dr. Leonie Man, at which time I felt the patient had parkinsonism, confounded by most likely vascular gait disorder, his history of intracranial hemorrhage and stroke, seizure disorder and overall complex medical history including neuropathy. I suggested no medication changes at the time. I asked him to use his cane at all times for risk of falling.  He previously followed with Dr. Leonie Man and was last seen by him on 02/26/2013, at which time Dr. Leonie Man suggested tapering off his carbidopa-levodopa because of daytime somnolence and orthostatic dizziness reported. He did not discontinue the C/L. Dr. Leonie Man increased his Requip to 1.5 mg 3 times a day , but the patient indicated, that he did not increase the requip. Essentially, no changes were made. He does not drive.  The patient has any underlying history of kidney cancer, subdural hematomas and  2010 and multiple subcortical infarcts, DM. He also has a remote history of seizures now off Dilantin without recent concern for seizure activity. He has a medical history of hypertension, type 2 diabetes, cancer, atrial fibrillation, diabetes and hyperlipidemia and is status post left kidney removal in 1998 for cancer and cholecystectomy in 1980.  His Sx date back to 43-20 y ago with R hand tremor. He has fallen in the past.   His Past Medical History Is Significant For: Past Medical History  Diagnosis Date  . recurrent lt renal ca dx'd 1998/05/2008    surg only  . Diabetes mellitus   . Hypertension   . Bradycardia   . Persistent atrial fibrillation   . Renal cell carcinoma     His Past Surgical History Is Significant For: Past Surgical History  Procedure Laterality Date  . Cholecystectomy    . Renal resection      His Family History Is Significant For: History reviewed. No pertinent family history.  His Social History Is Significant For: History   Social History  . Marital Status: Widowed    Spouse Name: N/A    Number of Children: 3  . Years of Education: college   Occupational History  . retired    Social History Main Topics  . Smoking status: Former Smoker    Types: Cigars    Quit date: 12/18/1958  . Smokeless tobacco: Never Used  . Alcohol Use: No  . Drug Use: No  . Sexual  Activity: None   Other Topics Concern  . None   Social History Narrative   Pt live at home with son   Pt is right handed   Education college    Caffeine states that he drinks caffeine free tea    His Allergies Are:  No Known Allergies:   His Current Medications Are:  Outpatient Encounter Prescriptions as of 05/01/2014  Medication Sig  . carbidopa-levodopa (SINEMET IR) 25-100 MG per tablet Take 25-100 tablets by mouth 3 (three) times daily.  . clotrimazole-betamethasone (LOTRISONE) cream Apply 1 application topically as needed.  Marland Kitchen glimepiride (AMARYL) 4 MG tablet Take 4 mg by mouth  2 (two) times daily.   Marland Kitchen lisinopril (PRINIVIL,ZESTRIL) 10 MG tablet Take 40 mg by mouth daily.   . metFORMIN (GLUCOPHAGE) 500 MG tablet Take 500 mg by mouth 2 (two) times daily with a meal.    . omeprazole (PRILOSEC) 40 MG capsule Take 40 mg by mouth daily at 3 pm.  . PARoxetine (PAXIL) 20 MG tablet Take 20 mg by mouth every morning.    Marland Kitchen rOPINIRole (REQUIP) 1 MG tablet Take 1 tablet (1 mg total) by mouth daily.  :  Review of Systems:  Out of a complete 14 point review of systems, all are reviewed and negative with the exception of these symptoms as listed below:   Review of Systems  All other systems reviewed and are negative.   Objective:  Neurologic Exam  Physical Exam Physical Examination:   Filed Vitals:   05/01/14 1125  BP: 137/79  Pulse: 66  Temp: 97.3 F (36.3 C)   General Examination: The patient is a very pleasant 80 y.o. male in no acute distress. He appears frail and mildly deconditioned.   HEENT: Normocephalic, atraumatic, pupils are equal, round and reactive to light and accommodation. Funduscopic exam is difficult due to cataracts bilaterally, this seems to be worse on the left. Extraocular tracking shows moderate saccadic breakdown without nystagmus noted. There is limitation to upper gaze. There is mild decrease in eye blink rate. Hearing is impaired. Face is symmetric with mild facial masking and normal facial sensation. There is no lip, neck or jaw tremor. Neck is moderately rigid with intact passive ROM. There are no carotid bruits on auscultation. Oropharynx exam reveals mild mouth dryness. No significant airway crowding is noted. Mallampati is class II. Tongue protrudes centrally and palate elevates symmetrically. There is no drooling.   Chest: is clear to auscultation without wheezing, rhonchi or crackles noted.  Heart: sounds are regular and normal without murmurs, rubs or gallops noted.   Abdomen: is soft, non-tender and non-distended with normal bowel  sounds appreciated on auscultation.  Extremities: There is 1+ pitting edema in the distal lower extremities bilaterally, mainly around the ankles. Pedal pulses are intact. There are no varicose veins.  Skin: is warm and dry with no trophic changes noted. Age-related changes are noted on the skin.   Musculoskeletal: exam reveals no obvious joint deformities, tenderness, joint swelling or erythema.  Neurologically:  Mental status: The patient is awake and alert, paying good  attention. He is able to partially provide the history. His son provides details. He is oriented to: person, place, time/date, situation, day of week, month of year and year. His memory, attention, language and knowledge are impaired mildly, unchanged. There is no aphasia, agnosia, apraxia or anomia. There is a mild degree of bradyphrenia. Speech is moderately hypophonic with minimal dysarthria noted. Mood is congruent and affect is normal.  Cranial nerves are as described above under HEENT exam. In addition, shoulder shrug is normal with equal shoulder height noted.  Motor exam: Normal bulk, and strength for age is noted. There are no dyskinesias noted. Tone is mildly rigid with presence of cogwheeling in the right upper extremity. There is overall mild bradykinesia. There is no drift or rebound.  There is a mild right upper extremity resting tremor today.  Romberg is negative.  Reflexes are 1+ in the upper extremities and 1+ in the lower extremities. Fine motor skills exam: Finger taps are moderately impaired on the right and moderately impaired on the left. Hand movements are moderately impaired on the right and mildly impaired on the left. RAP (rapid alternating patting) is mildly impaired on the right and mildly impaired on the left. Foot taps are moderately impaired on the right and moderately impaired on the left. Foot agility (in the form of heel stomping) is moderately impaired on the right and moderately impaired on the  left.    Cerebellar testing shows no dysmetria or intention tremor on finger to nose testing. Heel to shin is unremarkable bilaterally. There is no truncal or gait ataxia.   Sensory exam is intact to light touch, pinprick, vibration, temperature sense in the upper extremities and decreased to all modalities in the right more so the left lower extremities.   Gait, station and balance: He stands up from the seated position with mild difficulty and does need to push up with His hands. He needs no assistance. No veering to one side is noted. He is not noted to lean to the side. Posture is moderately stooped. Stance is wide-based. He walks with decrease in stride length and pace and decreased arm swing on both sides mildly. He turns in 4 steps. Tandem walk is not possible. Balance is mildly impaired. He is not able to do a toe or heel stance.     Assessment and Plan:   In summary, GORDY GOAR is a very pleasant 80 year old male with a complex history including kidney cancer, stroke, SDH, Sz and parkinsonism, neuropathy and gait disorder. He has been off of Sinemet for the past couple weeks and has not noticed any worsening of his parkinsonian symptoms. He does have mild parkinsonism with not much in the way of lateralization. He has new lower extremity swelling which I did not notice in December. I've asked him to see his primary care physician for this. In addition, I would also like to take him off of Requip especially since it can also cause swelling. He did not think it was particularly helpful and I think at this juncture we should take him off of all Parkinson's medications and monitor him. He is advised to drink more water. He is also advised to see his eye doctor for concern for cataracts. He may have some vascular parkinsonism as well because of his prior history of intracranial hemorrhages in strokes. His physical exam from he is not much changed with the exception of a resting tremor noted today  on the right side as well as lower extremity swelling. I asked him to continue to use his cane at all times. I encouraged the patient to eat healthy, exercise daily and keep well hydrated, to keep a scheduled bedtime and wake time routine, to not skip any meals and eat healthy snacks in between meals and to have protein with every meal. In particular, I stressed the importance of regular exercise, within of course the patient's  own mobility limitations. He is encouraged to use his cane consistently and walker if needed. As far as further diagnostic testing is concerned, I suggested he taper off of Requip by taking half a pill once daily for one week then stop altogether. I would like to see the patient back in 3 to 4 months, sooner if the need arises and encouraged  them to call with any interim questions, concerns, problems or updates and refill requests. The patient and his son were in agreement.

## 2014-07-10 ENCOUNTER — Telehealth: Payer: Self-pay | Admitting: Neurology

## 2014-07-10 DIAGNOSIS — G20 Parkinson's disease: Secondary | ICD-10-CM

## 2014-07-10 NOTE — Telephone Encounter (Signed)
Patient's son Timmothy Sours calling to state that he has noticed for the past 2 months that patient has gone downhill as far as his walking goes. Timmothy Sours notes that patient went from walking without a cane to needing a walker, and patient has stated that left leg is numb while standing. Timmothy Sours states patient has fallen 3 times in the last 3 weeks. Please return call and advise.

## 2014-07-13 NOTE — Telephone Encounter (Signed)
Called pt and left message asking pt's son Timmothy Sours to give our office a call back.

## 2014-07-14 NOTE — Telephone Encounter (Signed)
Patient's son Timmothy Sours returning Cathy's call, please call back and advise.

## 2014-07-15 NOTE — Telephone Encounter (Signed)
Called pt and spoke with pt's son Timmothy Sours and he stated that pt has fallen 3 times in the last 3 weeks and that pt was using a cane, but now uses the walker, since Dr. Rexene Alberts took pt off Requip 1 mg. Son also stated that pt is not as sharp thinking as pt used to be. Son would like to know what could help with the pt falling, does pt need to go back on requip? I informed the son that Dr. Rexene Alberts was out of the office and that I would be sending this to the [facility], Dr. Leta Baptist. Please advise

## 2014-07-15 NOTE — Telephone Encounter (Signed)
Will defer mgmt to Dr. Rexene Alberts. Can consider sooner follow up appt to eval gait decline. -VRP

## 2014-07-16 NOTE — Telephone Encounter (Signed)
Called pt and spoke with pt's son Timmothy Sours informing him per Dr. Leta Baptist that the pt could come in to see Dr. Leta Baptist or NP for eval for gait decline. Pt's son stated that he would wait until Dr. Rexene Alberts comes back from vacation. I explained to the son that Dr. Rexene Alberts would not be back for another week and Timmothy Sours stated that would be fine. Please advise

## 2014-07-17 MED ORDER — CARBIDOPA-LEVODOPA 25-100 MG PO TABS: 0.5000 | ORAL_TABLET | Freq: Three times a day (TID) | ORAL | Status: DC

## 2014-07-17 NOTE — Telephone Encounter (Signed)
Please advise patient's son that I would not like for him to restart Requip because of new leg swelling noted during last appt in May. He did however tell me that he was off of Sinemet and did not notice any significant change in his walking or mobility at the time. He can potentially try restarting his Sinemet at low dose. This would be 25-100 mg strength half pill 3 times a day. Rx done. We can try this for a couple of weeks and he would have to continue using his walker for safety.

## 2014-07-17 NOTE — Telephone Encounter (Signed)
Called pt's son Timmothy Sours and left a message for son to call back.

## 2014-07-24 NOTE — Telephone Encounter (Signed)
Patient's son returning Cathy's call, please call back and advise.

## 2014-07-30 ENCOUNTER — Encounter: Payer: Self-pay | Admitting: Oncology

## 2014-07-30 ENCOUNTER — Other Ambulatory Visit (HOSPITAL_BASED_OUTPATIENT_CLINIC_OR_DEPARTMENT_OTHER): Payer: Medicare Other

## 2014-07-30 ENCOUNTER — Ambulatory Visit (HOSPITAL_BASED_OUTPATIENT_CLINIC_OR_DEPARTMENT_OTHER): Payer: Medicare Other | Admitting: Oncology

## 2014-07-30 ENCOUNTER — Telehealth: Payer: Self-pay | Admitting: Oncology

## 2014-07-30 VITALS — BP 162/79 | HR 64 | Temp 98.1°F | Resp 18 | Ht 72.0 in | Wt 158.2 lb

## 2014-07-30 DIAGNOSIS — Z85528 Personal history of other malignant neoplasm of kidney: Secondary | ICD-10-CM

## 2014-07-30 DIAGNOSIS — C649 Malignant neoplasm of unspecified kidney, except renal pelvis: Secondary | ICD-10-CM

## 2014-07-30 DIAGNOSIS — N189 Chronic kidney disease, unspecified: Secondary | ICD-10-CM

## 2014-07-30 LAB — CBC WITH DIFFERENTIAL/PLATELET
BASO%: 0.5 % (ref 0.0–2.0)
BASOS ABS: 0 10*3/uL (ref 0.0–0.1)
EOS ABS: 0.1 10*3/uL (ref 0.0–0.5)
EOS%: 2.1 % (ref 0.0–7.0)
HCT: 43.1 % (ref 38.4–49.9)
HEMOGLOBIN: 14.1 g/dL (ref 13.0–17.1)
LYMPH#: 1.1 10*3/uL (ref 0.9–3.3)
LYMPH%: 28.2 % (ref 14.0–49.0)
MCH: 32.4 pg (ref 27.2–33.4)
MCHC: 32.7 g/dL (ref 32.0–36.0)
MCV: 99 fL — ABNORMAL HIGH (ref 79.3–98.0)
MONO#: 0.5 10*3/uL (ref 0.1–0.9)
MONO%: 13.3 % (ref 0.0–14.0)
NEUT#: 2.1 10*3/uL (ref 1.5–6.5)
NEUT%: 55.9 % (ref 39.0–75.0)
Platelets: 188 10*3/uL (ref 140–400)
RBC: 4.35 10*6/uL (ref 4.20–5.82)
RDW: 14.6 % (ref 11.0–14.6)
WBC: 3.8 10*3/uL — ABNORMAL LOW (ref 4.0–10.3)

## 2014-07-30 LAB — COMPREHENSIVE METABOLIC PANEL (CC13)
ALBUMIN: 3.7 g/dL (ref 3.5–5.0)
ALT: 12 U/L (ref 0–55)
ANION GAP: 9 meq/L (ref 3–11)
AST: 22 U/L (ref 5–34)
Alkaline Phosphatase: 82 U/L (ref 40–150)
BUN: 23.4 mg/dL (ref 7.0–26.0)
CALCIUM: 10 mg/dL (ref 8.4–10.4)
CHLORIDE: 106 meq/L (ref 98–109)
CO2: 26 meq/L (ref 22–29)
Creatinine: 1.2 mg/dL (ref 0.7–1.3)
Glucose: 111 mg/dl (ref 70–140)
POTASSIUM: 4.2 meq/L (ref 3.5–5.1)
Sodium: 140 mEq/L (ref 136–145)
Total Bilirubin: 0.59 mg/dL (ref 0.20–1.20)
Total Protein: 7 g/dL (ref 6.4–8.3)

## 2014-07-30 NOTE — Telephone Encounter (Signed)
, °

## 2014-07-30 NOTE — Progress Notes (Signed)
Hematology and Oncology Follow Up Visit  Frederick Tucker [id number] 10-23-1924 80 y.o. 07/30/2014 10:39 AM    CC: Frederick Tucker. Frederick Tucker, M.D. Frederick Tucker, M.D.    Principle Diagnosis: An 80 year old gentleman with renal cell carcinoma diagnosed in 1998, metastatic disease, and confirmed in 2009.   Prior Therapy: 1. Status post left nephrectomy in 1998.  He had a T2 N0 clear cell histology. 2. The patient developed periaortic lymphadenopathy, biopsy-proven, in May 2009.  He is continued on active surveillance and due to indolent nature of his disease, he has not been on anticancer treatment.  SECONDARY DIAGNOSES:  History of CVA as well as other comorbid conditions.  Current therapy: Observation and surveillance.   Interim History:   Frederick Tucker presents today for a followup visit with his son. Since his last visit, his mobility have declined. He is still ambulating but now would help of a walker. He has had some frequent falls and getting physical therapy at home.  He is not reporting any recent hospitalization, is not reporting any recent illnesses. His appetite and weight have also declined. He is not reporting any abdominal pain.  He has not reported any constitutional symptoms of fevers or chills or sweats.He did not report any flank pain or bleeding from his GU tract. He has not reported any recent infections or new complaints. It is unclear whether he have had a stroke that have affected his mobility in his lower extremity strength. He has not reported any chest pain or shortness of breath. Has not reported any nausea vomiting or abdominal pain. Does not report any lymphadenopathy or petechiae. Rest or view of system is unremarkable.   Medications: I have reviewed the patient's current medications.  Current Outpatient Prescriptions  Medication Sig Dispense Refill  . carbidopa-levodopa (SINEMET IR) 25-100 MG per tablet Take 0.5 tablets by mouth 3 (three) times daily.  135 tablet   3  . clotrimazole-betamethasone (LOTRISONE) cream Apply 1 application topically as needed.      Marland Kitchen glimepiride (AMARYL) 4 MG tablet Take 4 mg by mouth 2 (two) times daily.       Marland Kitchen lisinopril (PRINIVIL,ZESTRIL) 10 MG tablet Take 40 mg by mouth daily.       . metFORMIN (GLUCOPHAGE) 500 MG tablet Take 500 mg by mouth 2 (two) times daily with a meal.        . omeprazole (PRILOSEC) 40 MG capsule Take 40 mg by mouth daily at 3 pm.      . PARoxetine (PAXIL) 20 MG tablet Take 20 mg by mouth every morning.        Marland Kitchen rOPINIRole (REQUIP) 1 MG tablet Take 1 tablet (1 mg total) by mouth daily.  90 tablet  3   No current facility-administered medications for this visit.    Allergies: No Known Allergies  Past Medical History, Surgical history, Social history, and Family History were reviewed and updated.  Physical Exam: Blood pressure 162/79, pulse 64, temperature 98.1 F (36.7 C), temperature source Oral, resp. rate 18, height 6' (1.829 m), weight 158 lb 3.2 oz (71.759 kg). ECOG: 2 General appearance: alert a slightly lethargic today. Head: Normocephalic, without obvious abnormality, atraumatic Neck: no adenopathy Lymph nodes: Cervical, supraclavicular, and axillary nodes normal. Heart:regular rate and rhythm, S1, S2 normal, no murmur, click, rub or gallop Lung:chest clear, no wheezing, rales, normal symmetric air entry Abdomin: soft, non-tender, without masses or organomegaly EXT:no erythema, induration, or nodules   Lab Results: Lab Results  Component Value  Date   WBC 3.8* 07/30/2014   HGB 14.1 07/30/2014   HCT 43.1 07/30/2014   MCV 99.0* 07/30/2014   PLT 188 07/30/2014     Chemistry      Component Value Date/Time   NA 141 01/27/2014 1010   NA 142 07/15/2012 1008   NA 142 03/14/2012 0956   K 4.0 01/27/2014 1010   K 4.8* 07/15/2012 1008   K 4.6 03/14/2012 0956   CL 101 02/25/2013 0922   CL 100 07/15/2012 1008   CL 102 03/14/2012 0956   CO2 30* 01/27/2014 1010   CO2 29 07/15/2012 1008   CO2 33*  03/14/2012 0956   BUN 20.0 01/27/2014 1010   BUN 23* 07/15/2012 1008   BUN 24* 03/14/2012 0956   CREATININE 1.3 01/27/2014 1010   CREATININE 1.3* 07/15/2012 1008   CREATININE 1.38* 03/14/2012 0956      Component Value Date/Time   CALCIUM 10.5* 01/27/2014 1010   CALCIUM 9.6 07/15/2012 1008   CALCIUM 10.5 03/14/2012 0956   ALKPHOS 83 01/27/2014 1010   ALKPHOS 75 07/15/2012 1008   ALKPHOS 71 03/14/2012 0956   AST 21 01/27/2014 1010   AST 27 07/15/2012 1008   AST 23 03/14/2012 0956   ALT 6 01/27/2014 1010   ALT 26 07/15/2012 1008   ALT 9 03/14/2012 0956   BILITOT 0.74 01/27/2014 1010   BILITOT 0.60 07/15/2012 1008   BILITOT 0.5 03/14/2012 0956       Impression and Plan:  This is a pleasant 80 year old gentleman with the following issues. 1. Renal cell carcinoma.  He has stage IV disease.  He has periaortic lymphadenopathy and been monitored for the last 5 years. Last CT scan obtained on 01/27/2014  continue to show that the disease had been rather unchanged. There are no indication for treatment at this time and will plan continue observation and surveillance. I will repeat CT scan in 01/2015. Given the recent decline in his health, I instructed the patient and his son to hold off on the CT scan if his health continued to decline in February of 2016. If he gets stronger then we'll proceed with the scan as scheduled in February of 2016. 2. Chronic renal insufficiency is currently stable. 3. Cerebrovascular accident: It is unclear if he had a recent one but seems to have declined recently. Is getting more physical therapy to improve his mobility. 4. Follow up in 6 months.     [facility], MD 8/13/201510:39 AM

## 2014-10-06 ENCOUNTER — Encounter: Payer: Self-pay | Admitting: Neurology

## 2014-10-06 ENCOUNTER — Ambulatory Visit (INDEPENDENT_AMBULATORY_CARE_PROVIDER_SITE_OTHER): Payer: Medicare Other | Admitting: Neurology

## 2014-10-06 VITALS — BP 159/80 | HR 66 | Temp 98.9°F | Ht 72.0 in | Wt 161.0 lb

## 2014-10-06 DIAGNOSIS — G20 Parkinson's disease: Secondary | ICD-10-CM

## 2014-10-06 DIAGNOSIS — Z8673 Personal history of transient ischemic attack (TIA), and cerebral infarction without residual deficits: Secondary | ICD-10-CM

## 2014-10-06 DIAGNOSIS — R2689 Other abnormalities of gait and mobility: Secondary | ICD-10-CM

## 2014-10-06 DIAGNOSIS — Z8679 Personal history of other diseases of the circulatory system: Secondary | ICD-10-CM

## 2014-10-06 DIAGNOSIS — R296 Repeated falls: Secondary | ICD-10-CM

## 2014-10-06 DIAGNOSIS — R29898 Other symptoms and signs involving the musculoskeletal system: Secondary | ICD-10-CM

## 2014-10-06 NOTE — Progress Notes (Signed)
Subjective:    Patient ID: Frederick Tucker is a 80 y.o. male.  HPI    Interim history:   Frederick Tucker is a very pleasant 80 year old right-handed gentleman with a complex history of kidney cancer, stroke, SDH, Sz and parkinsonism, neuropathy and gait disorder, who presents for followup consultation of his parkinsonism and gait disorder. The patient is accompanied by his son again today. I last saw him on 05/01/2014, at which time he reported that he ran out of Sinemet end of April, and did not see a difference in his symptoms. He was still on ropinirole once daily. He had no recent falls. He was using his cane and his walker. He felt his memory was stable. I noted some lower extremity swelling which I felt was new. He was encouraged to see his primary care physician for this as well. I also asked him to taper off of Requip as it can cause lower extremity swelling. I suggested we continue without Parkinson's medication and observe him. His son called in July after he had noticed more gait dysfunction and I suggested trying C/L again, but he did not start that.   Today, his son reports, that patient fell about 5 times in July and thankfully did not hurt himself. His son discussed this with his PCP as well and they discussed doing an MRI, but they decided, it would not necessarily change the management. He has been in [facility] with [place]. He has been off of Requip. His swelling has improved. He has intermittently been dragging his left foot. He has been using a 2 wheeled walker. He has been staying with his son consistently since July. I saw him on 11/2314, at which time I felt he had a multifactorial gait disorder and some element of parkinsonism, but not IPD. Confounding factors were prior intracranial hemorrhages and prior stroke. He is followed for his kidney cancer by oncology. I suggested he reduce Sinemet to one pill 3 times a day. I kept Requip 1 mg at 1 pill once daily.  I first met him on  09/11/2013 at the request of my colleague, Dr. Leonie Man, at which time I felt the patient had parkinsonism, confounded by most likely vascular gait disorder, his history of intracranial hemorrhage and stroke, seizure disorder and overall complex medical history including neuropathy. I suggested no medication changes at the time. I asked him to use his cane at all times for risk of falling.  He previously followed with Dr. Leonie Man and was last seen by him on 02/26/2013, at which time Dr. Leonie Man suggested tapering off his carbidopa-levodopa because of daytime somnolence and orthostatic dizziness reported. He did not discontinue the C/L. Dr. Leonie Man increased his Requip to 1.5 mg 3 times a day , but the patient indicated, that he did not increase the requip. Essentially, no changes were made. He does not drive.  The patient has any underlying history of kidney cancer, subdural hematomas and 2010 and multiple subcortical infarcts, DM. He also has a remote history of seizures now off Dilantin without recent concern for seizure activity. He has a medical history of hypertension, type 2 diabetes, cancer, atrial fibrillation, diabetes and hyperlipidemia and is status post left kidney removal in 1998 for cancer and cholecystectomy in 1980.  His Sx date back to 69-22 y ago with R hand tremor. He has fallen in the past.    His Past Medical History Is Significant For: Past Medical History  Diagnosis Date  . recurrent lt renal ca  dx'd 1998/05/2008    surg only  . Diabetes mellitus   . Hypertension   . Bradycardia   . Persistent atrial fibrillation   . Renal cell carcinoma     His Past Surgical History Is Significant For: Past Surgical History  Procedure Laterality Date  . Cholecystectomy    . Renal resection      His Family History Is Significant For: History reviewed. No pertinent family history.  Her Social History Is Significant For: History   Social History  . Marital Status: Widowed    Spouse Name:  N/A    Number of Children: 3  . Years of Education: college   Occupational History  . retired    Social History Main Topics  . Smoking status: Former Smoker    Types: Cigars    Quit date: 12/18/1958  . Smokeless tobacco: Never Used  . Alcohol Use: No  . Drug Use: No  . Sexual Activity: None   Other Topics Concern  . None   Social History Narrative   Pt live at home with son   Pt is right handed   Education college    Caffeine states that he drinks caffeine free tea    His Allergies Are:  No Known Allergies:   His Current Medications Are:  Outpatient Encounter Prescriptions as of 10/06/2014  Medication Sig  . glimepiride (AMARYL) 4 MG tablet Take 4 mg by mouth 2 (two) times daily.   Marland Kitchen lisinopril (PRINIVIL,ZESTRIL) 10 MG tablet Take 40 mg by mouth daily.   . metFORMIN (GLUCOPHAGE) 500 MG tablet Take 500 mg by mouth 2 (two) times daily with a meal.    . PARoxetine (PAXIL) 20 MG tablet Take 20 mg by mouth every morning.    . [DISCONTINUED] carbidopa-levodopa (SINEMET IR) 25-100 MG per tablet Take 0.5 tablets by mouth 3 (three) times daily.  . [DISCONTINUED] clotrimazole-betamethasone (LOTRISONE) cream Apply 1 application topically as needed.  . [DISCONTINUED] omeprazole (PRILOSEC) 40 MG capsule Take 40 mg by mouth daily at 3 pm.  . [DISCONTINUED] rOPINIRole (REQUIP) 1 MG tablet Take 1 tablet (1 mg total) by mouth daily.  :  Review of Systems:  Out of a complete 14 point review of systems, all are reviewed and negative with the exception of these symptoms as listed below:   Review of Systems  Neurological:       Daytime sleepiness    Objective:  Neurologic Exam  Physical Exam Physical Examination:   Filed Vitals:   10/06/14 1216  BP: 159/80  Pulse: 66  Temp: 98.9 F (37.2 C)   General Examination: The patient is a very pleasant 80 y.o. male in no acute distress. He appears frail and mildly deconditioned.   HEENT: Normocephalic, atraumatic, pupils are equal,  round and reactive to light and accommodation. Funduscopic exam is difficult due to cataracts bilaterally, this seems to be worse on the left. Extraocular tracking shows moderate saccadic breakdown without nystagmus noted. There is limitation to upper gaze. There is mild decrease in eye blink rate. Hearing is impaired. Face is symmetric with mild facial masking and normal facial sensation. There is no lip, neck or jaw tremor. Neck is moderately rigid with intact passive ROM. There are no carotid bruits on auscultation. Oropharynx exam reveals mild mouth dryness. No significant airway crowding is noted. Mallampati is class II. Tongue protrudes centrally and palate elevates symmetrically. There is no drooling.   Chest: is clear to auscultation without wheezing, rhonchi or crackles noted.  Heart: sounds  are regular and normal without murmurs, rubs or gallops noted.   Abdomen: is soft, non-tender and non-distended with normal bowel sounds appreciated on auscultation.  Extremities: There is no pitting edema in the distal lower extremities bilaterally, improved. Pedal pulses are intact. There are no varicose veins.  Skin: is warm and very dry with no trophic changes noted. Age-related changes are noted on the skin.   Musculoskeletal: exam reveals no obvious joint deformities, tenderness, joint swelling or erythema.  Neurologically:  Mental status: The patient is awake and alert, paying good  attention. He is able to partially provide the history. His son provides details. He is oriented to: person, place, time/date, situation, day of week, month of year and year. His memory, attention, language and knowledge are impaired mildly, unchanged. There is no aphasia, agnosia, apraxia or anomia. There is a mild degree of bradyphrenia. Speech is moderately hypophonic with minimal dysarthria noted. Mood is congruent and affect is normal.   Cranial nerves are as described above under HEENT exam. In addition,  shoulder shrug is normal with equal shoulder height noted.  Motor exam: Normal bulk, and strength for age is noted. There are no dyskinesias noted. Tone is mildly rigid with presence of cogwheeling in the right upper extremity>LUE. There is overall mild bradykinesia. There is no drift or rebound.  There is a mild right upper extremity resting tremor today, more apparent.  Romberg is negative.  Reflexes are 1+ in the upper extremities and 1+ in the lower extremities. Fine motor skills exam: Finger taps are moderately impaired on the right and moderately impaired on the left. Hand movements are moderately impaired on the right and mildly impaired on the left. RAP (rapid alternating patting) is mildly impaired on the right and mildly impaired on the left. Foot taps are moderately impaired on the right and moderately impaired on the left. Foot agility (in the form of heel stomping) is moderately impaired on the right and moderately impaired on the left.    Cerebellar testing shows no dysmetria or intention tremor on finger to nose testing.  Sensory exam is intact to light touch, pinprick, vibration, temperature sense in the upper extremities and decreased to all modalities in the right more so the left lower extremities.   Gait, station and balance: He stands up from the seated position with mild difficulty and does need to push up with His hands. He needs no assistance. No veering to one side is noted. He is not noted to lean to the side. Posture is moderately stooped. Stance is wide-based. He walks with decrease in stride length and pace and decreased arm swing on both sides mildly. He turns in 4 steps. Tandem walk is not possible. Balance is mildly impaired. He is not able to do a toe or heel stance.     Assessment and Plan:   In summary, Frederick Tucker is a very pleasant 80 year old male with a complex history including kidney cancer, strokes, SDH, Sz and parkinsonism, neuropathy and gait disorder. He  has been off of Sinemet and Requip and while his LE swelling improved, he has had worsening RUE tremor and worsening balanced and gait. Overall, he has mild parkinsonism with perhaps mild lateralization to the R. He is advised to drink more water. He has had several falls within a two-week period. I would like to proceed with a brain MRI without contrast. He has a prior history of strokes. He is at risk for vascular parkinsonism as well because of  his prior history of intracranial hemorrhages and strokes. I would like to restart him on Sinemet low dose. I gave him a prescription in July which his son filled but never started. I think we should start low at half a pill twice a day for couple weeks and then if there is improvement go up to half a pill 3 times a day thereafter. I reiterated that the medication should be taken away from mealtimes and I also advised him regarding common side effects. He is to continue home health physical therapy and use his walker at all times. We will call his son with the MRI results. I would like to see the patient back in 4 months, sooner if the need arises and encouraged  them to call with any interim questions, concerns, problems or updates and refill requests. The patient and his son were in agreement.

## 2014-10-06 NOTE — Patient Instructions (Signed)
We will do another MRI brain. We will call you with the test result.  We will try sinemet again, Sinemet (generic name: carbidopa-levodopa) 25/100 mg: Take half a pill twice daily (8 AM and noon) for 2 weeks, then may increase to half a pill 3 times a day thereafter, if you see a difference in your tremor and your walking. Please try to take the medication away from you mealtimes, that is, ideally either one hour before or 2 hours after your meal to ensure optimal absorption. The medication can interfere with the protein content of your meal and trying to the protein in your food and therefore not get fully absorbed.  Common side effects reported are: Nausea, vomiting, sedation, confusion, lightheadedness. Rare side effects include hallucinations, severe nausea or vomiting, diarrhea and significant drop in blood pressure especially when going from lying to standing or from sitting to standing.

## 2014-10-15 ENCOUNTER — Ambulatory Visit
Admission: RE | Admit: 2014-10-15 | Discharge: 2014-10-15 | Disposition: A | Discharge status: Home / Self Care | Payer: Medicare Other | Source: Ambulatory Visit | Attending: Neurology | Admitting: Neurology

## 2014-10-15 DIAGNOSIS — R29898 Other symptoms and signs involving the musculoskeletal system: Secondary | ICD-10-CM

## 2014-10-15 DIAGNOSIS — Z8679 Personal history of other diseases of the circulatory system: Secondary | ICD-10-CM

## 2014-10-15 DIAGNOSIS — R296 Repeated falls: Secondary | ICD-10-CM

## 2014-10-15 DIAGNOSIS — R2689 Other abnormalities of gait and mobility: Secondary | ICD-10-CM

## 2014-10-15 DIAGNOSIS — R269 Unspecified abnormalities of gait and mobility: Secondary | ICD-10-CM

## 2014-10-15 DIAGNOSIS — Z8673 Personal history of transient ischemic attack (TIA), and cerebral infarction without residual deficits: Secondary | ICD-10-CM

## 2014-10-15 DIAGNOSIS — G20 Parkinson's disease: Secondary | ICD-10-CM

## 2014-10-21 NOTE — Progress Notes (Signed)
Quick Note:  Please call patient regarding the recent brain MRI: The brain scan showed moderate volume loss which we call atrophy. There were changes in the deeper structures of the brain, which we call white matter changes or microvascular changes. These were reported as moderate in His case. These are tiny white spots, that occur with time and are seen in a variety of conditions, including with normal aging, chronic hypertension, chronic headaches, especially migraine HAs, chronic diabetes, chronic hyperlipidemia. These are not strokes and no mass or lesion or contrast enhancement was seen which is reassuring. Again, there were no acute findings, such as a recent stroke, or mass or blood products, but older findings were confirmed.   Compared to 2010 changes have progressed a little bit but when I compare to the MRI report from 2012, changes are not that much progressed within the last 3 years. Overall expected changes were seen in the most recent MRI brain. No further action is required on this test at this time, other than re-enforcing the importance of good blood pressure control, good cholesterol control, good blood sugar control, and weight management. Please remind patient to keep any upcoming appointments or tests and to call us with any interim questions, concerns, problems or updates. Thanks,  Star Age, MD, PhD    ______

## 2014-10-22 NOTE — Progress Notes (Signed)
Quick Note:  Spoke to pt and relayed the MRI results of brain. He verbalized understanding. Overall since 2012 not much progression of atrophy and SVD. ______

## 2014-12-23 ENCOUNTER — Other Ambulatory Visit: Payer: Self-pay | Admitting: Neurology

## 2014-12-24 NOTE — Telephone Encounter (Signed)
Last OV note says patient was not taking this med.  It is no longer on med list.  I called the patient to clarify.  Got no answer.  Left message.

## 2015-01-19 ENCOUNTER — Ambulatory Visit (HOSPITAL_COMMUNITY): Payer: Medicare Other

## 2015-01-19 ENCOUNTER — Other Ambulatory Visit: Payer: Medicare Other

## 2015-01-26 ENCOUNTER — Ambulatory Visit: Payer: Medicare Other | Admitting: Oncology

## 2015-01-26 ENCOUNTER — Encounter: Payer: Self-pay | Admitting: *Deleted

## 2015-01-26 NOTE — Progress Notes (Signed)
Patient missed appt today with dr Alen Blew. Called patient's home and spoke with son. Patient is actively dying and is under hospice care. Family wanted to thank dr Alen Blew for the care he gave their father.

## 2015-02-08 ENCOUNTER — Ambulatory Visit: Payer: Medicare Other | Admitting: Neurology

## 2015-02-16 DEATH — deceased

## 2015-11-08 IMAGING — CT CT ABD-PELV W/O CM
1 of 2 series · 14 of 32 positions shown, 18 images · non-contrast
Comparison: CT CHEST W/O CM dated 02/25/2013

CLINICAL DATA: Renal cell carcinoma. Left nephrectomy. Restaging
exam

EXAM:
CT CHEST, ABDOMEN AND PELVIS WITHOUT CONTRAST
TECHNIQUE: Multidetector CT imaging of the chest, abdomen and pelvis was
performed following the standard protocol without IV contrast.

[Series 2: cap w/o w/o st · axial · non-contrast · 0.73mm/px · z∈[-651,-66]mm · 14 of 139 slices shown, 18 images]
[im 11/139  mediastinal]
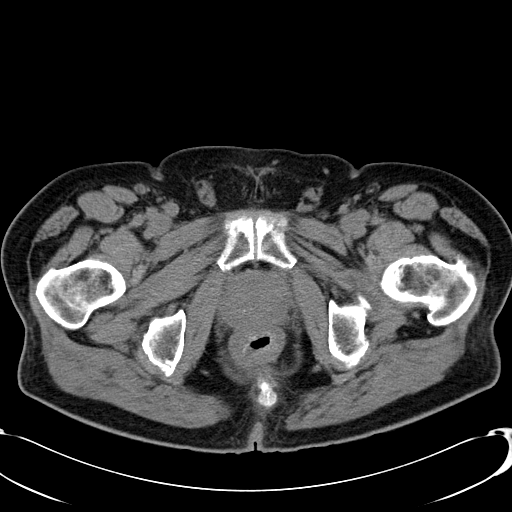
[im 11/139  lung]
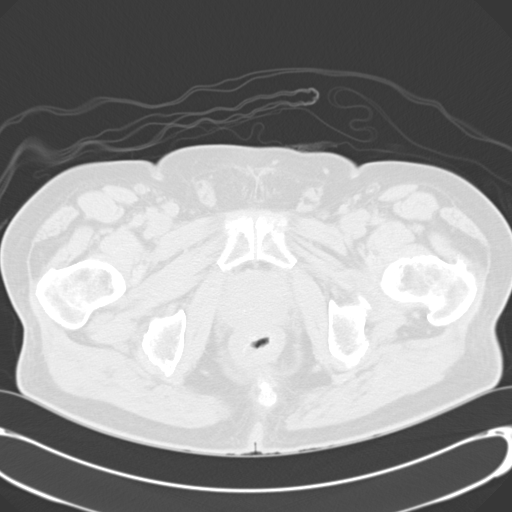
[im 22/139  lung]
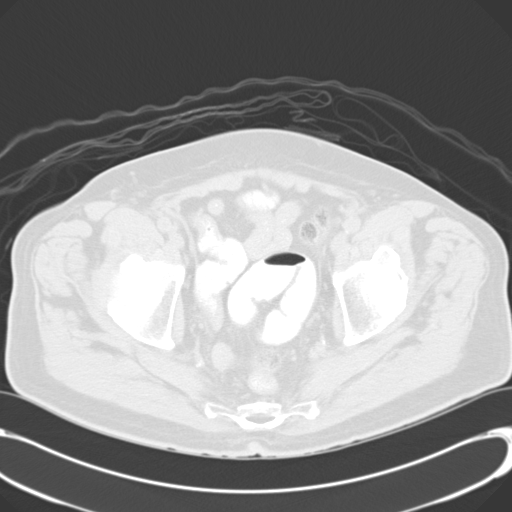
[im 32/139  lung]
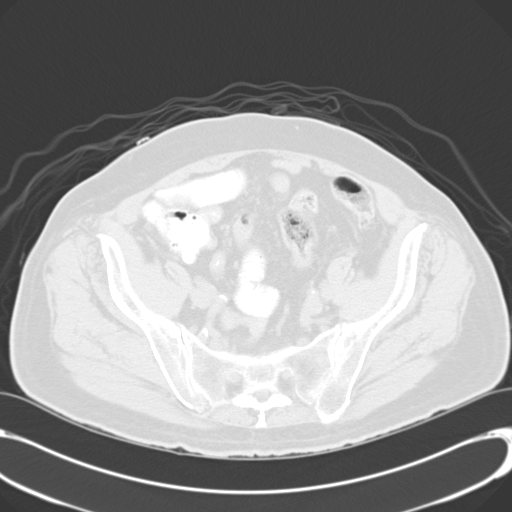
[im 43/139  lung]
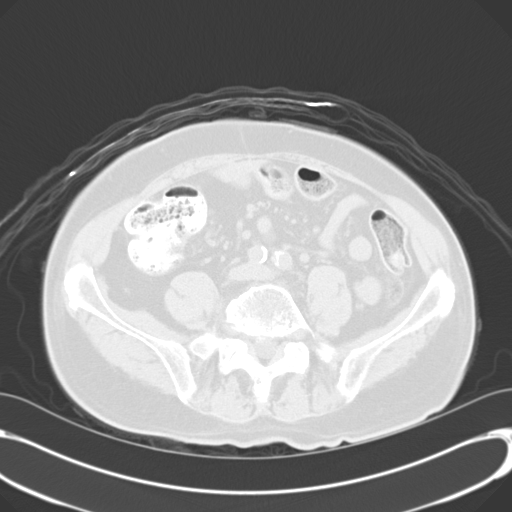
[im 54/139  mediastinal]
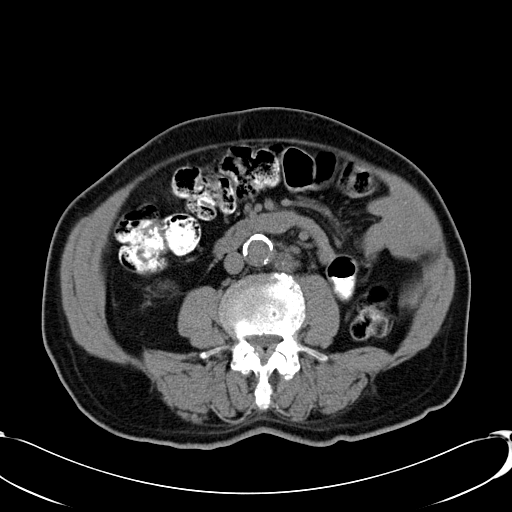
[im 54/139  lung]
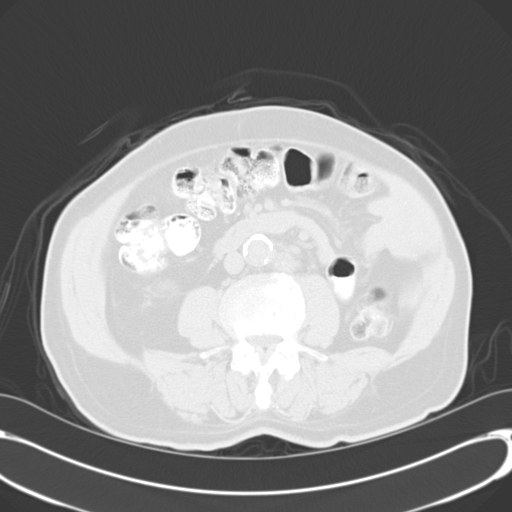
[im 64/139  lung]
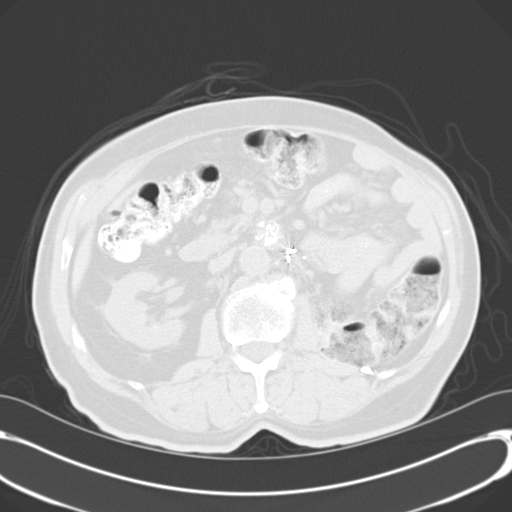
[im 68/139  lung]
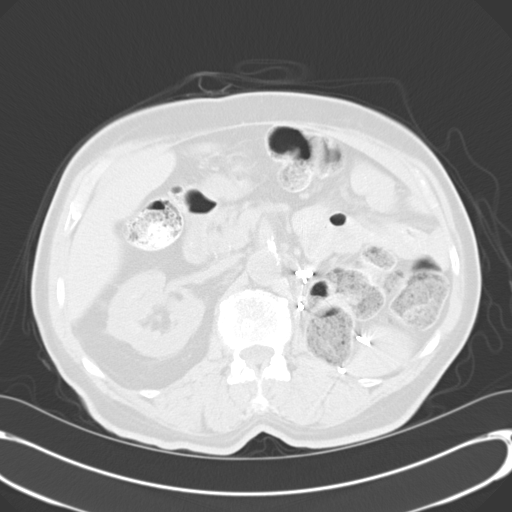
[im 70/139  lung]
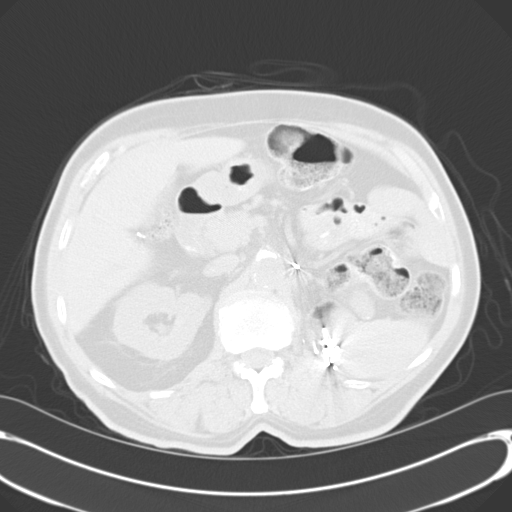
[im 75/139  mediastinal]
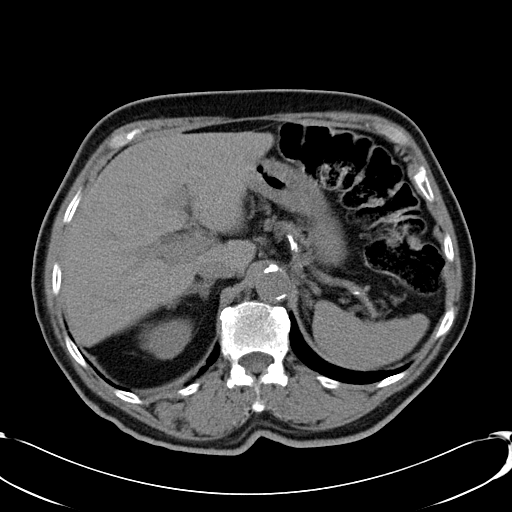
[im 75/139  lung]
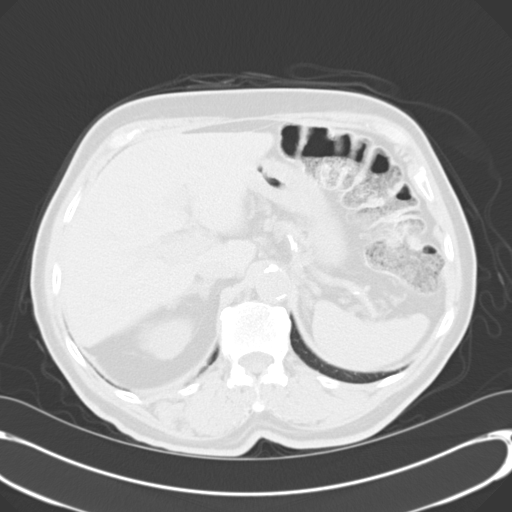
[im 85/139  lung]
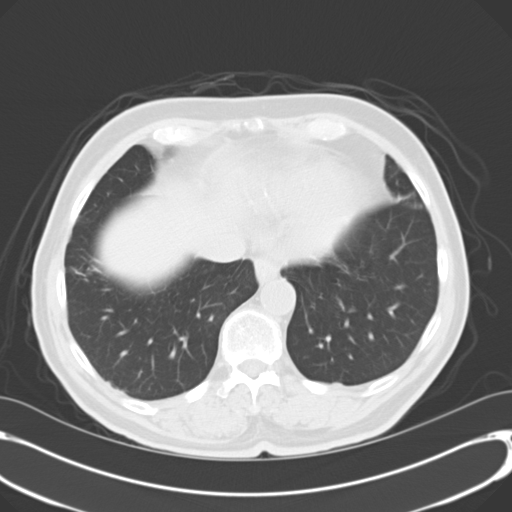
[im 96/139  lung]
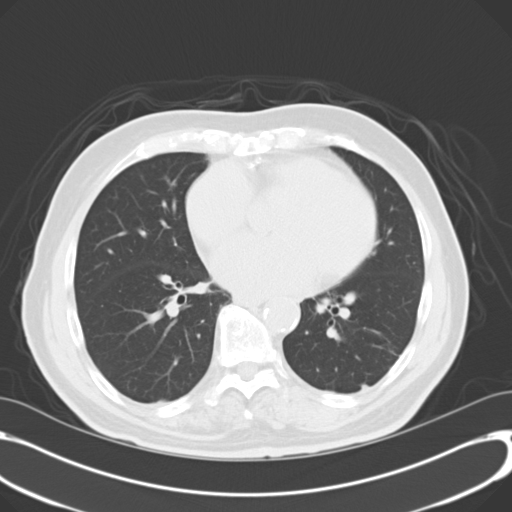
[im 107/139  lung]
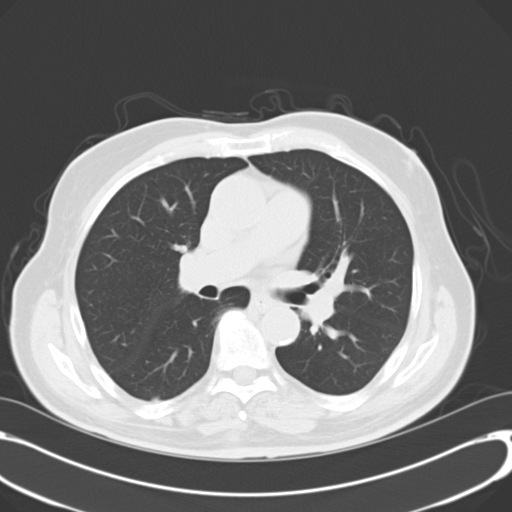
[im 117/139  mediastinal]
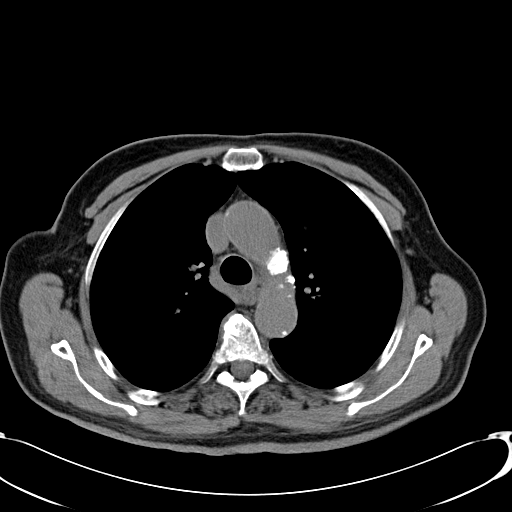
[im 117/139  lung]
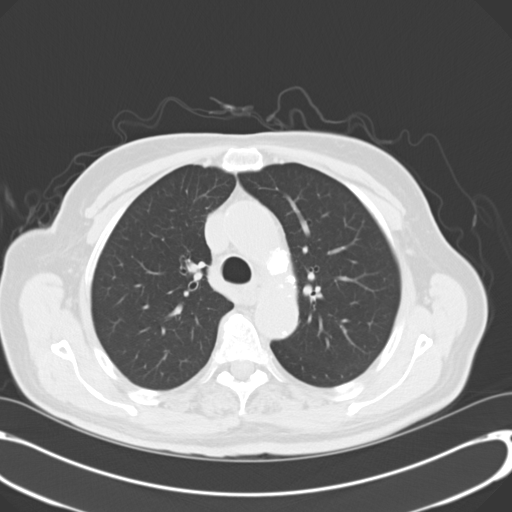
[im 128/139  lung]
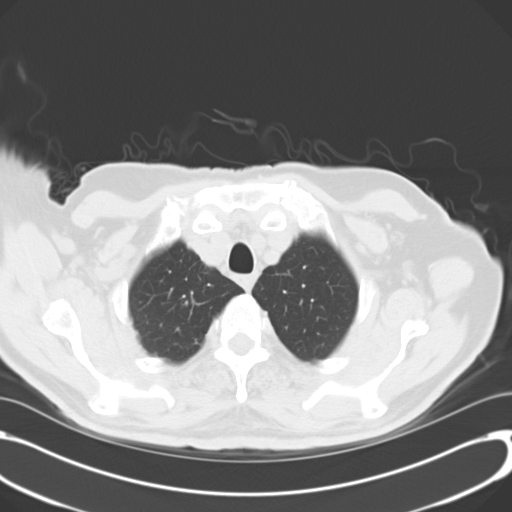

[14 of 32 positions shown; findings below may reference images not displayed]

FINDINGS: CT CHEST FINDINGS

No axillary or supraclavicular adenopathy. No mediastinal or hilar
lymphadenopathy. No pericardial fluid. Review of the lung parenchyma
demonstrates several foci of nodular pleural thickening in the left
and right lower lobes posteriorly which are not changed from prior.
No new or suspicious pulmonary nodules.

CT ABDOMEN AND PELVIS FINDINGS

Non IV contrast images demonstrate no focal hepatic lesion. Patient
status post cholecystectomy. The pancreas and spleen are normal.
Adrenal glands normal.

There are multiple vascular clips in the left nephrectomy bed. There
is a soft tissue mass with central calcifications left adjacent to
the aorta measuring 32 x 30 mm not changed from 32 x 33 mm on prior.
Anterior to the aorta at the same level there is a peripherally
calcified 31 mm by 24 mm unchanged from 32 x 24 mm on prior. No new
retroperitoneal adenopathy or nodularity. The right kidney is
unchanged. There is a high-density cyst extending from the right
kidney measuring 12 mm unchanged from prior. A medial 9 mm lesion is
also unchanged.

Adjacent to the tail the pancreas at the splenic hilum, there is a
21 mm low-density lesion not changed from 22 mm on prior.

No free fluid in the pelvis. The prostate gland and bladder normal.
No pelvic lymphadenopathy. No aggressive osseous lesion.
IMPRESSION: 1. No evidence of thoracic metastasis.
2. Stable left nephrectomy bed. Soft tissue and peripherally
calcified masses left adjacent to the aorta are unchanged.
3. Stable small exophytic lesions in the right kidney.
4. Stable small cystic lesion in the tail of pancreas.
